# Patient Record
Sex: Female | Born: 1970 | Hispanic: Yes | Smoking: Former smoker
Health system: Southern US, Community
[De-identification: ages and names within clinical notes are randomized; demographics above are authoritative.]

## PROBLEM LIST (undated history)

## (undated) DIAGNOSIS — A150 Tuberculosis of lung: Secondary | ICD-10-CM

## (undated) DIAGNOSIS — K219 Gastro-esophageal reflux disease without esophagitis: Secondary | ICD-10-CM

## (undated) HISTORY — PX: CHOLECYSTECTOMY: SHX55

## (undated) HISTORY — PX: ABDOMINAL HYSTERECTOMY: SHX81

---

## 2004-04-20 ENCOUNTER — Ambulatory Visit: Payer: Self-pay | Admitting: Family Medicine

## 2010-04-16 DIAGNOSIS — L309 Dermatitis, unspecified: Secondary | ICD-10-CM | POA: Insufficient documentation

## 2010-05-18 ENCOUNTER — Emergency Department: Payer: Self-pay | Admitting: Emergency Medicine

## 2010-10-25 DIAGNOSIS — M130 Polyarthritis, unspecified: Secondary | ICD-10-CM | POA: Insufficient documentation

## 2011-01-08 DIAGNOSIS — M659 Synovitis and tenosynovitis, unspecified: Secondary | ICD-10-CM | POA: Insufficient documentation

## 2011-02-06 DIAGNOSIS — D1630 Benign neoplasm of short bones of unspecified lower limb: Secondary | ICD-10-CM | POA: Insufficient documentation

## 2012-08-08 ENCOUNTER — Emergency Department: Payer: Self-pay | Admitting: Internal Medicine

## 2012-12-02 ENCOUNTER — Emergency Department: Payer: Self-pay | Admitting: Emergency Medicine

## 2012-12-02 LAB — URINALYSIS, COMPLETE
Blood: NEGATIVE
Ketone: NEGATIVE
Leukocyte Esterase: NEGATIVE
Nitrite: NEGATIVE
Ph: 6 (ref 4.5–8.0)
RBC,UR: 2 /HPF (ref 0–5)
Specific Gravity: 1.017 (ref 1.003–1.030)
WBC UR: NONE SEEN /HPF (ref 0–5)

## 2013-02-19 DIAGNOSIS — G8929 Other chronic pain: Secondary | ICD-10-CM | POA: Insufficient documentation

## 2014-03-09 ENCOUNTER — Emergency Department: Payer: Self-pay | Admitting: Emergency Medicine

## 2015-09-22 ENCOUNTER — Encounter: Payer: Self-pay | Admitting: Sports Medicine

## 2015-09-22 ENCOUNTER — Ambulatory Visit (INDEPENDENT_AMBULATORY_CARE_PROVIDER_SITE_OTHER): Payer: Self-pay | Admitting: Sports Medicine

## 2015-09-22 DIAGNOSIS — B070 Plantar wart: Secondary | ICD-10-CM

## 2015-09-22 DIAGNOSIS — M79671 Pain in right foot: Secondary | ICD-10-CM

## 2015-09-22 DIAGNOSIS — M79672 Pain in left foot: Secondary | ICD-10-CM

## 2015-09-22 NOTE — Patient Instructions (Signed)
Plantar Warts Plantar warts are small growths on the bottom of the foot (sole). Warts are caused by a type of germ (virus). Most warts are not painful, and they usually do not cause problems. Sometimes, plantar warts can cause pain when you walk. Warts often go away on their own in time. Treatments may be done if needed. HOME CARE General Instructions  Apply creams or solutions only as told by your doctor. Follow these steps if your doctor tells you to do so:  Soak your foot in warm water.  Remove the top layer of softened skin before you apply the medicine. You can use a pumice stone to remove the tissue.  After you apply the medicine, put a bandage over the area of the wart.  Repeat the process every day or as told by your doctor.  Do not scratch or pick at a wart.  Wash your hands after you touch a wart.  If a wart is painful, try putting a bandage with a hole in the middle over the wart.  Keep all follow-up visits as told by your doctor. This is important. Prevention  Wear shoes and socks. Change socks every day.  Keep your feet clean and dry.  Check your feet often.  Avoid direct contact with warts on other people. GET HELP IF:  Your warts do not improve after treatment.  You have redness, swelling, or pain at the site of a wart.  You have bleeding from a wart, and the bleeding does not stop when you put light pressure on the wart.  You have diabetes and you get a wart.   This information is not intended to replace advice given to you by your health care provider. Make sure you discuss any questions you have with your health care provider.   Document Released: 04/27/2010 Document Revised: 12/14/2014 Document Reviewed: 06/20/2014 Elsevier Interactive Patient Education [employer].

## 2015-09-22 NOTE — Progress Notes (Signed)
Patient ID: Rhonda Olsen, female   DOB: [birth year], 45 y.o.   MRN: [id number] Subjective: Rhonda Olsen is a 45 y.o. female patient who presents to office for evaluation of Right> Left foot pain secondary to painful wart at the ball and 3rd toes. Patient has tried OTC freezing with no relief in symptoms. Reports feels best in tennis shoes. States that she has been dealing with this for several years. Patient denies any other pedal complaints.   Patient Active Problem List   Diagnosis Date Noted  . Chronic female pelvic pain 02/19/2013  . 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 Synovitis and tenosynovitis 01/08/2011  . Inflammation of multiple joints 10/25/2010  . Dermatitis, eczematoid 04/16/2010    No current outpatient prescriptions on file prior to visit.   No current facility-administered medications on file prior to visit.    No Known Allergies  Objective:  General: Alert and oriented x3 in no acute distress  Dermatology: Keratotic lesions present measuring <0.3cm at Sub met 2 R>L and 3rd toes bilateral with no skin lines transversing the lesion, pain is present with medial lateral pressure to the lesion, capillaries with pin point bleeding noted, no webspace macerations, no ecchymosis bilateral, all nails x 10 are well manicured.  Vascular: Dorsalis Pedis and Posterior Tibial pedal pulses 2/4, Capillary Fill Time 3 seconds, + pedal hair growth bilateral, no edema bilateral lower extremities, Temperature gradient within normal limits.  Neurology: Johney Maine sensation intact via light touch bilateral.  Musculoskeletal: Mild tenderness with palpation at the lesion sites on Right>Left, Muscular strength 5/5 in all groups without pain or limitation on range of motion. No lower extremity muscular or boney deformity noted.  Assessment and Plan: Problem List Items Addressed This Visit    None    Visit Diagnoses    Plantar wart of both feet     -  Primary    Foot pain, bilateral          -Complete examination performed -Discussed treatment options for wart -Parred keratoic warty lesions using a chisel blade; treated the areas with Catharidin covered with bandaid; Advised patient of blistering reaction that will occur from application of medication and once this happens replace bandaid with neosporin and tape/bandaid -Patient to return to office in 4 weeks or sooner if condition worsens.  Landis Martins, DPM

## 2015-10-13 ENCOUNTER — Ambulatory Visit (INDEPENDENT_AMBULATORY_CARE_PROVIDER_SITE_OTHER): Payer: Self-pay | Admitting: Sports Medicine

## 2015-10-13 ENCOUNTER — Encounter: Payer: Self-pay | Admitting: Sports Medicine

## 2015-10-13 DIAGNOSIS — B070 Plantar wart: Secondary | ICD-10-CM

## 2015-10-13 DIAGNOSIS — M79671 Pain in right foot: Secondary | ICD-10-CM

## 2015-10-13 DIAGNOSIS — M79672 Pain in left foot: Secondary | ICD-10-CM

## 2015-10-13 NOTE — Progress Notes (Signed)
Patient ID: Rhonda Olsen, female   DOB: 1970/06/15, 45 y.o.   MRN: [id number]  Subjective: Rhonda Olsen is a 45 y.o. female patient who returns to office for follow up evaluation of Right> Left foot pain secondary to painful wart at the ball and 3rd toes. Patient states that she got a small blister reaction and a little relief from the treatment however lesions are still there; patient is interested in surgical excision and laser. Patient denies any other pedal complaints.   Patient Active Problem List   Diagnosis Date Noted  . Chronic female pelvic pain 02/19/2013  . 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 Synovitis and tenosynovitis 01/08/2011  . Inflammation of multiple joints 10/25/2010  . Dermatitis, eczematoid 04/16/2010    Current Outpatient Prescriptions on File Prior to Visit  Medication Sig Dispense Refill  . sertraline (ZOLOFT) 50 MG tablet Take 50 mg by mouth.     No current facility-administered medications on file prior to visit.    No Known Allergies  Objective:  General: Alert and oriented x3 in no acute distress  Dermatology: Keratotic lesions present measuring <0.3cm at Sub met 2 R>L and 3rd toes bilateral with no skin lines transversing the lesion, pain is present with medial lateral pressure to the lesion, capillaries with pin point bleeding noted, there is also callus at medial hallux bilateral and sub met 5 left, no webspace macerations, no ecchymosis bilateral, all nails x 10 are well manicured.  Vascular: Dorsalis Pedis and Posterior Tibial pedal pulses 2/4, Capillary Fill Time 3 seconds, + pedal hair growth bilateral, no edema bilateral lower extremities, Temperature gradient within normal limits.  Neurology: Johney Maine sensation intact via light touch bilateral.  Musculoskeletal: Mild tenderness with palpation at the lesion sites on Right>Left, Muscular strength 5/5 in all groups without pain or limitation on range of  motion. No lower extremity muscular or boney deformity noted.  Assessment and Plan: Problem List Items Addressed This Visit    None    Visit Diagnoses    Plantar wart of both feet    -  Primary    Foot pain, bilateral          -Complete examination performed -Discussed treatment options for wart and answered all questions -Patient reports that she's self pay and can not at this time afford excision and laser -Parred keratoic warty lesions using a chisel blade; treated the areas with Catharidin covered with bandaid; Advised patient of blistering reaction that will occur from application of medication and once this happens replace bandaid with neosporin and tape/bandaid -Recommended topical wart treatment creams; patient declined due to financial restraints at this time  -Patient to return to office in 4 weeks or sooner if condition worsens.  Landis Martins, DPM

## 2015-11-17 ENCOUNTER — Ambulatory Visit (INDEPENDENT_AMBULATORY_CARE_PROVIDER_SITE_OTHER): Payer: Self-pay | Admitting: Sports Medicine

## 2015-11-17 ENCOUNTER — Encounter: Payer: Self-pay | Admitting: Sports Medicine

## 2015-11-17 DIAGNOSIS — B070 Plantar wart: Secondary | ICD-10-CM

## 2015-11-17 DIAGNOSIS — M79672 Pain in left foot: Secondary | ICD-10-CM

## 2015-11-17 DIAGNOSIS — M79671 Pain in right foot: Secondary | ICD-10-CM

## 2015-11-17 NOTE — Progress Notes (Signed)
Patient ID: Rhonda Olsen, female   DOB: 06-10-1970, 45 y.o.   MRN: [id number]  Subjective: Rhonda Olsen is a 45 y.o. female patient who returns to office for follow up evaluation of Right> Left foot pain secondary to painful warts at the ball and 3rd toes. Patient states she thinks that the 2nd treatment worked and that she got a blister with the lesions now gone. Patient denies any other pedal complaints.   Patient Active Problem List   Diagnosis Date Noted  . Chronic female pelvic pain 02/19/2013  . 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 Synovitis and tenosynovitis 01/08/2011  . Inflammation of multiple joints 10/25/2010  . Dermatitis, eczematoid 04/16/2010    Current Outpatient Prescriptions on File Prior to Visit  Medication Sig Dispense Refill  . sertraline (ZOLOFT) 50 MG tablet Take 50 mg by mouth.     No current facility-administered medications on file prior to visit.     No Known Allergies  Objective:  General: Alert and oriented x3 in no acute distress  Dermatology: Keratotic warty lesions resolved, no webspace macerations, no ecchymosis bilateral, all nails x 10 are well manicured.  Vascular: Dorsalis Pedis and Posterior Tibial pedal pulses 2/4, Capillary Fill Time 3 seconds, + pedal hair growth bilateral, no edema bilateral lower extremities, Temperature gradient within normal limits.  Neurology: Gross sensation intact via light touch bilateral.  Musculoskeletal: No tenderness to palpation bilateral, Muscular strength 5/5 in all groups without pain or limitation on range of motion. No lower extremity muscular or boney deformity noted.  Assessment and Plan: Problem List Items Addressed This Visit    None    Visit Diagnoses    Plantar wart of both feet    -  Primary   Foot pain, bilateral         -Complete examination performed -Warts are resolved at this time -Advised good hygiene habits to prevent recurrence   -Patient to return to office as needed or sooner if condition worsens.  Landis Martins, DPM

## 2016-04-29 ENCOUNTER — Emergency Department: Payer: Worker's Compensation

## 2016-04-29 ENCOUNTER — Encounter: Payer: Self-pay | Admitting: Emergency Medicine

## 2016-04-29 ENCOUNTER — Emergency Department
Admission: EM | Admit: 2016-04-29 | Discharge: 2016-04-29 | Disposition: A | Discharge status: Home / Self Care | Payer: Worker's Compensation | Attending: Emergency Medicine | Admitting: Emergency Medicine

## 2016-04-29 DIAGNOSIS — S199XXA Unspecified injury of neck, initial encounter: Secondary | ICD-10-CM | POA: Diagnosis present

## 2016-04-29 DIAGNOSIS — Y929 Unspecified place or not applicable: Secondary | ICD-10-CM | POA: Diagnosis not present

## 2016-04-29 DIAGNOSIS — Y939 Activity, unspecified: Secondary | ICD-10-CM | POA: Insufficient documentation

## 2016-04-29 DIAGNOSIS — M25511 Pain in right shoulder: Secondary | ICD-10-CM | POA: Insufficient documentation

## 2016-04-29 DIAGNOSIS — M542 Cervicalgia: Secondary | ICD-10-CM | POA: Insufficient documentation

## 2016-04-29 DIAGNOSIS — M545 Low back pain: Secondary | ICD-10-CM | POA: Diagnosis not present

## 2016-04-29 DIAGNOSIS — Y990 Civilian activity done for income or pay: Secondary | ICD-10-CM | POA: Diagnosis not present

## 2016-04-29 DIAGNOSIS — W19XXXA Unspecified fall, initial encounter: Secondary | ICD-10-CM | POA: Insufficient documentation

## 2016-04-29 MED ORDER — KETOROLAC TROMETHAMINE 30 MG/ML IJ SOLN
30.0000 mg | Freq: Once | INTRAMUSCULAR | Status: AC
  Administered 2016-04-29: 30 mg via INTRAMUSCULAR
  Filled 2016-04-29: qty 1

## 2016-04-29 MED ORDER — MELOXICAM 7.5 MG PO TABS: 7.5000 mg | ORAL_TABLET | Freq: Every day | ORAL | 1 refills | Status: AC

## 2016-04-29 NOTE — ED Triage Notes (Signed)
Pt from home with back pain as well as neck and shoulder pain after fall at work. Pt went to urgent care and was told to come here. Pt speaks spanish. She works at [employer] in [employer].

## 2016-04-29 NOTE — ED Notes (Signed)
The workers compensation was completed and walked to the lab.

## 2016-04-29 NOTE — ED Provider Notes (Signed)
[facility] Emergency Department Provider Note  ____________________________________________  Time seen: Approximately 4:35 PM  I have reviewed the triage vital signs and the nursing notes.   HISTORY  Chief Complaint Fall    HPI Rhonda Olsen is a 46 y.o. female presenting to the emergency department after a fall at her place of work, [employer]. Patient denies hitting her head or loss of consciousness. She reports neck pain worsened with movement, right shoulder pain and low back pain. She currently rates her back pain at 6 out of 10 in intensity and describes it as sore. Patient states that she resumed working today but aforementioned pain prompted her to seek care at the emergency department. She denies chest pain, shortness of breath, blurry vision, nausea, vomiting and abdominal pain. She has not attempted alleviating measures. Patient is right handed.    History reviewed. No pertinent past medical history.  Patient Active Problem List   Diagnosis Date Noted  . Chronic female pelvic pain 02/19/2013  . 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 Synovitis and tenosynovitis 01/08/2011  . Inflammation of multiple joints 10/25/2010  . Dermatitis, eczematoid 04/16/2010    Past Surgical History:  Procedure Laterality Date  . CHOLECYSTECTOMY      Prior to Admission medications   Medication Sig Start Date End Date Taking? Authorizing Provider  meloxicam (MOBIC) 7.5 MG tablet Take 1 tablet (7.5 mg total) by mouth daily. 04/29/16 05/06/16  Lannie Fields, PA-C  sertraline (ZOLOFT) 50 MG tablet Take 50 mg by mouth.    Historical Provider, MD    Allergies Patient has no known allergies.  History reviewed. No pertinent family history.  Social History Social History  Substance Use Topics  . Smoking status: Never Smoker  . Smokeless tobacco: Never Used  . Alcohol use No     Review of Systems  Eyes: No visual changes.  No discharge ENT: No upper respiratory complaints. Cardiovascular: no chest pain. Respiratory: no cough. No SOB. Gastrointestinal: No abdominal pain.  No nausea, no vomiting.  No diarrhea.  No constipation. Musculoskeletal: Patient has right shoulder pain, neck pain and low back pain.  Skin: Negative for rash, abrasions, lacerations, ecchymosis. Neurological: Negative for headaches, focal weakness or numbness. ____________________________________________   PHYSICAL EXAM:  VITAL SIGNS: ED Triage Vitals  Enc Vitals Group     BP 04/29/16 1556 (!) 106/46     Pulse Rate 04/29/16 1556 68     Resp --      Temp 04/29/16 1556 98.8 F (37.1 C)     Temp Source 04/29/16 1556 Oral     SpO2 04/29/16 1556 100 %     Weight 04/29/16 1557 130 lb (59 kg)     Height 04/29/16 1557 5\' 2"  (1.575 m)     Head Circumference --      Peak Flow --      Pain Score 04/29/16 1558 5     Pain Loc --      Pain Edu? --      Excl. in [place]? --      Constitutional: Alert and oriented. Well appearing and in no acute distress. Eyes: Conjunctivae are normal. PERRL. EOMI. Head: Atraumatic. Neck: No stridor. Patient has pain with range of motion testing. No pain with palpation along the cervical spine. Cardiovascular: Normal rate, regular rhythm. Normal S1 and S2.  Good peripheral circulation. Respiratory: Normal respiratory effort without tachypnea or retractions. Lungs CTAB. Good air entry to the bases  with no decreased or absent breath sounds. Musculoskeletal: Patient has 5/5 strength in the upper and lower extremities bilaterally. Full range of motion at the shoulder, elbow and wrist bilaterally. Full range of motion at the hip, knee and ankle bilaterally. No changes in gait. Patient demonstrates no weakness or pain with rotator cuff testing, right. Patient has no tenderness elicited with palpation along the [facility] joint. Patient's low back pain is worsened with flexion at the spine. No pain with palpation along the  lumbar and thoracic regions of the spine. Palpable radial, ulnar and dorsalis pedis pulses bilaterally and symmetrically. Neurologic:  Normal speech and language. No gross focal neurologic deficits are appreciated. Cranial nerves: 2-10 normal as tested. Cerebellar: Finger-nose-finger WNL, heel to shin WNL.  Skin:  Skin is warm, dry and intact. No rash noted. Psychiatric: Mood and affect are normal. Speech and behavior are normal. Patient exhibits appropriate insight and judgement. ____________________________________________   LABS (all labs ordered are listed, but only abnormal results are displayed)  Labs Reviewed - No data to display ____________________________________________  EKG   ____________________________________________  RADIOLOGY Unk Pinto, personally viewed and evaluated these images (plain radiographs) as part of my medical decision making, as well as reviewing the written report by the radiologist.  Dg Cervical Spine Complete  Result Date: 04/29/2016 CLINICAL DATA:  Fall. EXAM: CERVICAL SPINE - COMPLETE 4+ VIEW COMPARISON:  12/02/2012 FINDINGS: There is no evidence of cervical spine fracture or prevertebral soft tissue swelling. Alignment is normal. No other significant bone abnormalities are identified. IMPRESSION: Negative cervical spine radiographs. Electronically Signed   By: Kerby Moors M.D.   On: 04/29/2016 17:30   Dg Lumbar Spine Complete  Result Date: 04/29/2016 CLINICAL DATA:  [place] at work today EXAM: [place] 4+ VIEW COMPARISON:  12/02/2012 FINDINGS: 5 non-rib-bearing lumbar vertebra. Disc space narrowing at L5-S1 with small endplate spurs. Vertebral body and disc space heights otherwise maintained. Minimal retrolisthesis at L5-S1. No acute fracture, additional subluxation or bone destruction. No spondylolysis. SI joints preserved. IMPRESSION: Degenerative disc disease changes L5-S1. No acute osseous abnormalities. Electronically Signed    By: Lavonia Dana M.D.   On: 04/29/2016 17:27    ____________________________________________    PROCEDURES  Procedure(s) performed:    Procedures    Medications  ketorolac (TORADOL) 30 MG/ML injection 30 mg (30 mg Intramuscular Given 04/29/16 1700)     ____________________________________________   INITIAL IMPRESSION / ASSESSMENT AND PLAN / ED COURSE  Pertinent labs & imaging results that were available during my care of the patient were reviewed by me and considered in my medical decision making (see chart for details).  Review of the [place] CSRS was performed in accordance of the [place] prior to dispensing any controlled drugs.   Assessment and Plan:  Fall: Patient presents with right shoulder pain, neck pain and low back pain after a fall today at her work. DG lumbar spine and DG cervical spine did not reveal acute bony abnormalities. On physical exam, patient demonstrated full range of motion at the right shoulder with no rotator cuff weakness. No pain was elicited with shoulder range of motion, right. X-ray examination of the right shoulder is not warranted at this time. Patient was prescribed Mobic at discharge. A referral was made to orthopedics, Dr. Roland Rack. Patient was advised to make an appointment in one week if low back pain, right shoulder pain and neck pain persists. All patient questions were answered. _________________________________________  FINAL CLINICAL IMPRESSION(S) / ED DIAGNOSES  Final diagnoses:  Fall, initial encounter      NEW MEDICATIONS STARTED DURING THIS VISIT:  Discharge Medication List as of 04/29/2016  5:45 PM    START taking these medications   Details  meloxicam (MOBIC) 7.5 MG tablet Take 1 tablet (7.5 mg total) by mouth daily., Starting Mon 04/29/2016, Until Mon 05/06/2016, Print            This chart was dictated using voice recognition software/Dragon. Despite best efforts to proofread, errors can occur which can change the meaning.  Any change was purely unintentional.    Lannie Fields, PA-C 04/29/16 1902    Harvest Dark, MD 04/29/16 [phone]

## 2016-09-10 ENCOUNTER — Emergency Department
Admission: EM | Admit: 2016-09-10 | Discharge: 2016-09-10 | Disposition: A | Discharge status: Home / Self Care | Payer: Self-pay | Attending: Emergency Medicine | Admitting: Emergency Medicine

## 2016-09-10 DIAGNOSIS — R519 Headache, unspecified: Secondary | ICD-10-CM

## 2016-09-10 DIAGNOSIS — R110 Nausea: Secondary | ICD-10-CM | POA: Insufficient documentation

## 2016-09-10 DIAGNOSIS — J029 Acute pharyngitis, unspecified: Secondary | ICD-10-CM | POA: Insufficient documentation

## 2016-09-10 DIAGNOSIS — Z79899 Other long term (current) drug therapy: Secondary | ICD-10-CM | POA: Insufficient documentation

## 2016-09-10 DIAGNOSIS — R51 Headache: Secondary | ICD-10-CM

## 2016-09-10 LAB — URINALYSIS, COMPLETE (UACMP) WITH MICROSCOPIC
BACTERIA UA: NONE SEEN
BILIRUBIN URINE: NEGATIVE
Glucose, UA: NEGATIVE mg/dL
Ketones, ur: NEGATIVE mg/dL
Leukocytes, UA: NEGATIVE
NITRITE: NEGATIVE
PROTEIN: NEGATIVE mg/dL
Specific Gravity, Urine: 1.003 — ABNORMAL LOW (ref 1.005–1.030)
WBC UA: NONE SEEN WBC/hpf (ref 0–5)
pH: 6 (ref 5.0–8.0)

## 2016-09-10 LAB — COMPREHENSIVE METABOLIC PANEL
ALBUMIN: 4.5 g/dL (ref 3.5–5.0)
ALT: 19 U/L (ref 14–54)
ANION GAP: 7 (ref 5–15)
AST: 24 U/L (ref 15–41)
Alkaline Phosphatase: 87 U/L (ref 38–126)
BILIRUBIN TOTAL: 0.6 mg/dL (ref 0.3–1.2)
BUN: 13 mg/dL (ref 6–20)
CO2: 29 mmol/L (ref 22–32)
Calcium: 9.3 mg/dL (ref 8.9–10.3)
Chloride: 102 mmol/L (ref 101–111)
Creatinine, Ser: 0.4 mg/dL — ABNORMAL LOW (ref 0.44–1.00)
GFR calc non Af Amer: >60 mL/min (ref >60)
GLUCOSE: 92 mg/dL (ref 65–99)
POTASSIUM: 3.5 mmol/L (ref 3.5–5.1)
Sodium: 138 mmol/L (ref 135–145)
TOTAL PROTEIN: 8.3 g/dL — AB (ref 6.5–8.1)

## 2016-09-10 LAB — CBC
HEMATOCRIT: 37.8 % (ref 35.0–47.0)
HEMOGLOBIN: 13 g/dL (ref 12.0–16.0)
MCH: 29.6 pg (ref 26.0–34.0)
MCHC: 34.2 g/dL (ref 32.0–36.0)
MCV: 86.3 fL (ref 80.0–100.0)
Platelets: 294 10*3/uL (ref 150–440)
RBC: 4.38 MIL/uL (ref 3.80–5.20)
RDW: 12.7 % (ref 11.5–14.5)
WBC: 5.1 10*3/uL (ref 3.6–11.0)

## 2016-09-10 LAB — LIPASE, BLOOD: Lipase: 27 U/L (ref 11–51)

## 2016-09-10 MED ORDER — ONDANSETRON 4 MG PO TBDP: 4.0000 mg | ORAL_TABLET | Freq: Three times a day (TID) | ORAL | 0 refills | Status: DC | PRN

## 2016-09-10 MED ORDER — ONDANSETRON 4 MG PO TBDP
4.0000 mg | ORAL_TABLET | Freq: Once | ORAL | Status: AC | PRN
  Administered 2016-09-10: 4 mg via ORAL
  Filled 2016-09-10: qty 1

## 2016-09-10 MED ORDER — SODIUM CHLORIDE 0.9 % IV BOLUS (SEPSIS)
1000.0000 mL | Freq: Once | INTRAVENOUS | Status: AC
  Administered 2016-09-10: 1000 mL via INTRAVENOUS

## 2016-09-10 MED ORDER — DIPHENHYDRAMINE HCL 50 MG/ML IJ SOLN
25.0000 mg | Freq: Once | INTRAMUSCULAR | Status: AC
  Administered 2016-09-10: 25 mg via INTRAVENOUS
  Filled 2016-09-10: qty 1

## 2016-09-10 MED ORDER — KETOROLAC TROMETHAMINE 30 MG/ML IJ SOLN
30.0000 mg | Freq: Once | INTRAMUSCULAR | Status: AC
  Administered 2016-09-10: 30 mg via INTRAVENOUS
  Filled 2016-09-10: qty 1

## 2016-09-10 MED ORDER — DEXAMETHASONE SODIUM PHOSPHATE 10 MG/ML IJ SOLN
10.0000 mg | Freq: Once | INTRAMUSCULAR | Status: AC
  Administered 2016-09-10: 10 mg via INTRAVENOUS
  Filled 2016-09-10 (×2): qty 1

## 2016-09-10 MED ORDER — METOCLOPRAMIDE HCL 5 MG/ML IJ SOLN
10.0000 mg | Freq: Once | INTRAMUSCULAR | Status: AC
  Administered 2016-09-10: 10 mg via INTRAVENOUS
  Filled 2016-09-10: qty 2

## 2016-09-10 NOTE — ED Triage Notes (Signed)
Pt arrives to ER via POV c/o headache, sore throat, nausea and vomiting greater than 1 week. Pt reports her lymph nodes are swollen in her neck. Pt tearful Pt alert and oriented X4, active, cooperative, pt in NAD. RR even and unlabored, color WNL.   Interpreter used for discharge.

## 2016-09-10 NOTE — ED Provider Notes (Signed)
[facility] Emergency Department Provider Note  Time seen: 3:47 PM  I have reviewed the triage vital signs and the nursing notes.   HISTORY  Chief Complaint Headache; Sore Throat; and Nausea Hospital interpreter used for this evaluation.   HPI Rhonda Olsen is a 46 y.o. female with a past medical history of chronic pelvic pain, presents to the emergency department with vague complaints.Patient states for the past several months she has been experiencing intermittent pain and pressure sensation in her neck. Denies any pain with swallowing or any difficulty breathing. It feels like a sore throat. She also states intermittent headache, worse over the past 2 days. Denies any weakness or numbness. Patient also states a generalized fatigue sensation. Denies any fever, cough or congestion. Patient does take months to years of vague abdominal discomfort which she describes as bloating, stating intermittent nausea and vomiting including one episode of vomiting this morning. Denies any dysuria or hematuria. Denies vaginal bleeding or discharge. Patient's main complaint today appears to be headache with nausea.  History reviewed. No pertinent past medical history.  Patient Active Problem List   Diagnosis Date Noted  . Chronic female pelvic pain 02/19/2013  . 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 Synovitis and tenosynovitis 01/08/2011  . Inflammation of multiple joints 10/25/2010  . Dermatitis, eczematoid 04/16/2010    Past Surgical History:  Procedure Laterality Date  . CHOLECYSTECTOMY      Prior to Admission medications   Medication Sig Start Date End Date Taking? Authorizing Provider  sertraline (ZOLOFT) 50 MG tablet Take 50 mg by mouth.    [provider]    No Known Allergies  No family history on file.  Social History Social History  Substance Use Topics  . Smoking status: Never Smoker  . Smokeless tobacco:  Never Used  . Alcohol use No    Review of Systems Constitutional: Negative for fever. Eyes: Negative for visual changes. ENT: Some congestion. Cardiovascular: Negative for chest pain. Respiratory: Negative for shortness of breath. Gastrointestinal: Intermittent abdominal bloating. Positive for nausea or vomiting. Negative for diarrhea. Genitourinary: Negative for dysuria. Negative for vaginal bleeding or discharge. Musculoskeletal: Negative for back pain. Skin: Negative for rash. Neurological: Moderate headache. All other ROS negative  ____________________________________________   PHYSICAL EXAM:  VITAL SIGNS: ED Triage Vitals [09/10/16 1147]  Enc Vitals Group     BP 113/65     Pulse Rate 76     Resp 18     Temp 97.7 F (36.5 C)     Temp Source Oral     SpO2 100 %     Weight 138 lb (62.6 kg)     Height 5\' 4"  (1.626 m)     Head Circumference      Peak Flow      Pain Score 8     Pain Loc      Pain Edu?      Excl. in [place]?     Constitutional: Alert. Well appearing and in no distress. Eyes: Normal exam ENT   Head: Normocephalic and atraumatic.   Mouth/Throat: Mucous membranes are moist. No pharyngeal erythema or exudate. Normal oral pharynx. Cardiovascular: Normal rate, regular rhythm. No murmur Respiratory: Normal respiratory effort without tachypnea nor retractions. Breath sounds are clear Gastrointestinal: Soft and nontender. No distention.   Musculoskeletal: Nontender with normal range of motion in all extremities.  Neurologic:  Normal speech and language. No gross focal neurologic deficits Skin:  Skin is warm,  dry and intact.  Psychiatric: Mood and affect are normal.   ____________________________________________    INITIAL IMPRESSION / ASSESSMENT AND PLAN / ED COURSE  Pertinent labs & imaging results that were available during my care of the patient were reviewed by me and considered in my medical decision making (see chart for details).  The  patient presents the emergency department with multiple complaints including headache intermittent times weeks but worse over the past 2 days along with nausea and vomiting. Abdominal bloating times years. Sore throat sensation times months. On exam overall the patient appears well she does appear to have a small left tympanic membrane rupture, patient denies any pain in the left ear but states a history of ear infections especially when she was younger (likely chronic). Patient's throat appears normal with no erythema or exudate. No increased cervical lymphadenopathy noted. Patient states intermittent abdominal pain times years but nontender exam today. Patient's labs overall are normal. Given the patient's headache and nausea we will treat with Toradol Reglan and Benadryl. We will also dose IV Decadron. Overall the patient appears very well, no distress. Spanish interpreter used throughout the evaluation and exam.  Patient states headache is much improved currently a 1 or 2/10. Patient will be discharged home with PCP follow-up.  ____________________________________________   FINAL CLINICAL IMPRESSION(S) / ED DIAGNOSES  Headache Nausea    Harvest Dark, MD 09/10/16 1655

## 2016-09-10 NOTE — ED Notes (Addendum)
Interpreter at bedside with MD. Pt c/o sore neck xfew months but worse today. Pt describes pain as "chocking sensation". Pt denies SOB or fever. Pt A&OX4, appears fatigued

## 2016-09-10 NOTE — ED Notes (Signed)
Discharge done with interpreter at bedside

## 2018-09-18 ENCOUNTER — Emergency Department: Payer: HRSA Program

## 2018-09-18 ENCOUNTER — Emergency Department
Admission: EM | Admit: 2018-09-18 | Discharge: 2018-09-18 | Disposition: A | Discharge status: Home / Self Care | Payer: HRSA Program | Attending: Emergency Medicine | Admitting: Emergency Medicine

## 2018-09-18 ENCOUNTER — Encounter: Payer: Self-pay | Admitting: Emergency Medicine

## 2018-09-18 ENCOUNTER — Other Ambulatory Visit: Payer: Self-pay

## 2018-09-18 DIAGNOSIS — Z7189 Other specified counseling: Secondary | ICD-10-CM

## 2018-09-18 DIAGNOSIS — R0602 Shortness of breath: Secondary | ICD-10-CM | POA: Diagnosis not present

## 2018-09-18 DIAGNOSIS — Z20828 Contact with and (suspected) exposure to other viral communicable diseases: Secondary | ICD-10-CM | POA: Diagnosis not present

## 2018-09-18 DIAGNOSIS — R5383 Other fatigue: Secondary | ICD-10-CM | POA: Insufficient documentation

## 2018-09-18 DIAGNOSIS — R51 Headache: Secondary | ICD-10-CM | POA: Diagnosis present

## 2018-09-18 HISTORY — DX: Tuberculosis of lung: A15.0

## 2018-09-18 LAB — COMPREHENSIVE METABOLIC PANEL
ALT: 24 U/L (ref 0–44)
AST: 28 U/L (ref 15–41)
Albumin: 4.3 g/dL (ref 3.5–5.0)
Alkaline Phosphatase: 93 U/L (ref 38–126)
Anion gap: 9 (ref 5–15)
BUN: 12 mg/dL (ref 6–20)
CO2: 26 mmol/L (ref 22–32)
Calcium: 8.7 mg/dL — ABNORMAL LOW (ref 8.9–10.3)
Chloride: 104 mmol/L (ref 98–111)
Creatinine, Ser: 0.45 mg/dL (ref 0.44–1.00)
GFR calc Af Amer: >60 mL/min (ref >60)
GFR calc non Af Amer: >60 mL/min (ref >60)
Glucose, Bld: 97 mg/dL (ref 70–99)
Potassium: 3.7 mmol/L (ref 3.5–5.1)
Sodium: 139 mmol/L (ref 135–145)
Total Bilirubin: 0.8 mg/dL (ref 0.3–1.2)
Total Protein: 7.9 g/dL (ref 6.5–8.1)

## 2018-09-18 LAB — CBC WITH DIFFERENTIAL/PLATELET
Abs Immature Granulocytes: 0 10*3/uL (ref 0.00–0.07)
Basophils Absolute: 0 10*3/uL (ref 0.0–0.1)
Basophils Relative: 1 %
Eosinophils Absolute: 0.1 10*3/uL (ref 0.0–0.5)
Eosinophils Relative: 3 %
HCT: 37 % (ref 36.0–46.0)
Hemoglobin: 12.2 g/dL (ref 12.0–15.0)
Immature Granulocytes: 0 %
Lymphocytes Relative: 42 %
Lymphs Abs: 2 10*3/uL (ref 0.7–4.0)
MCH: 28.8 pg (ref 26.0–34.0)
MCHC: 33 g/dL (ref 30.0–36.0)
MCV: 87.3 fL (ref 80.0–100.0)
Monocytes Absolute: 0.3 10*3/uL (ref 0.1–1.0)
Monocytes Relative: 7 %
Neutro Abs: 2.2 10*3/uL (ref 1.7–7.7)
Neutrophils Relative %: 47 %
Platelets: 260 10*3/uL (ref 150–400)
RBC: 4.24 MIL/uL (ref 3.87–5.11)
RDW: 12.3 % (ref 11.5–15.5)
WBC: 4.6 10*3/uL (ref 4.0–10.5)
nRBC: 0 % (ref 0.0–0.2)

## 2018-09-18 LAB — URINALYSIS, COMPLETE (UACMP) WITH MICROSCOPIC
Bacteria, UA: NONE SEEN
Bilirubin Urine: NEGATIVE
Glucose, UA: NEGATIVE mg/dL
Hgb urine dipstick: NEGATIVE
Ketones, ur: NEGATIVE mg/dL
Leukocytes,Ua: NEGATIVE
Nitrite: NEGATIVE
Protein, ur: NEGATIVE mg/dL
Specific Gravity, Urine: 1.005 (ref 1.005–1.030)
pH: 6 (ref 5.0–8.0)

## 2018-09-18 LAB — POCT PREGNANCY, URINE: Preg Test, Ur: NEGATIVE

## 2018-09-18 LAB — SARS CORONAVIRUS 2 BY RT PCR (HOSPITAL ORDER, PERFORMED IN ~~LOC~~ HOSPITAL LAB): SARS Coronavirus 2: NEGATIVE

## 2018-09-18 LAB — TROPONIN I: Troponin I: <0.03 ng/mL (ref <0.03)

## 2018-09-18 LAB — FIBRIN DERIVATIVES D-DIMER (ARMC ONLY): Fibrin derivatives D-dimer (ARMC): 379.21 ng/mL (FEU) (ref 0.00–499.00)

## 2018-09-18 MED ORDER — ONDANSETRON 4 MG PO TBDP: 4.0000 mg | ORAL_TABLET | Freq: Three times a day (TID) | ORAL | 0 refills | Status: AC | PRN

## 2018-09-18 NOTE — Discharge Instructions (Addendum)
Your covid 19 test is negative. Take zofran odt for nausea as needed

## 2018-09-18 NOTE — ED Provider Notes (Addendum)
[facility] Emergency Department Provider Note  ____________________________________________   None    (approximate)  I have reviewed the triage vital signs and the nursing notes.   HISTORY  Chief Complaint Headache and Shortness of Breath    HPI Rhonda Olsen is a 48 y.o. female presents emergency department complaint of headache and shortness of breath for 2 days.  2 coworkers have tested positive for [place].  She is concerned.  She has some nausea.  No vomiting or diarrhea.  History of TB in which she took the medication.  She is concerned now about her lungs.  She denies fever or chills.  She denies chest pain.  She denies any urinary symptoms.    Past Medical History:  Diagnosis Date  . TB (pulmonary tuberculosis)     Patient Active Problem List   Diagnosis Date Noted  . Chronic female pelvic pain 02/19/2013  . 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 Synovitis and tenosynovitis 01/08/2011  . Inflammation of multiple joints 10/25/2010  . Dermatitis, eczematoid 04/16/2010    Past Surgical History:  Procedure Laterality Date  . CHOLECYSTECTOMY      Prior to Admission medications   Medication Sig Start Date End Date Taking? Authorizing Provider  ondansetron (ZOFRAN-ODT) 4 MG disintegrating tablet Take 1 tablet (4 mg total) by mouth every 8 (eight) hours as needed. 09/18/18   Markes Shatswell, Linden Dolin, PA-C  sertraline (ZOLOFT) 50 MG tablet Take 50 mg by mouth.    [provider]    Allergies Patient has no known allergies.  History reviewed. No pertinent family history.  Social History Social History   Tobacco Use  . Smoking status: Never Smoker  . Smokeless tobacco: Never Used  Substance Use Topics  . Alcohol use: No    Alcohol/week: 0.0 standard drinks  . Drug use: No    Review of Systems  Constitutional: No fever/chills, complaints of headache Eyes: No visual changes. ENT: No sore throat.  Respiratory: Denies cough complains of shortness of breath Genitourinary: Negative for dysuria. Musculoskeletal: Negative for back pain. Skin: Negative for rash.    ____________________________________________   PHYSICAL EXAM:  VITAL SIGNS: ED Triage Vitals  Enc Vitals Group     BP 09/18/18 1309 106/74     Pulse Rate 09/18/18 1309 74     Resp 09/18/18 1309 18     Temp 09/18/18 1309 98.6 F (37 C)     Temp Source 09/18/18 1309 Oral     SpO2 09/18/18 1309 100 %     Weight 09/18/18 1310 145 lb (65.8 kg)     Height 09/18/18 1310 5\' 4"  (1.626 m)     Head Circumference --      Peak Flow --      Pain Score 09/18/18 1309 8     Pain Loc --      Pain Edu? --      Excl. in [place]? --     Constitutional: Alert and oriented. Well appearing and in no acute distress. Eyes: Conjunctivae are normal.  Head: Atraumatic. Nose: No congestion/rhinnorhea. Mouth/Throat: Mucous membranes are moist.   Neck:  supple no lymphadenopathy noted Cardiovascular: Normal rate, regular rhythm. Heart sounds are normal Respiratory: Normal respiratory effort.  No retractions, lungs c t a  Abd: soft nontender bs normal all 4 quad GU: deferred Musculoskeletal: FROM all extremities, warm and well perfused Neurologic:  Normal speech and language.  Skin:  Skin is warm, dry and intact.  No rash noted. Psychiatric: Mood and affect are normal. Speech and behavior are normal.  ____________________________________________   LABS (all labs ordered are listed, but only abnormal results are displayed)  Labs Reviewed  COMPREHENSIVE METABOLIC PANEL - Abnormal; Notable for the following components:      Result Value   Calcium 8.7 (*)    All other components within normal limits  URINALYSIS, COMPLETE (UACMP) WITH MICROSCOPIC - Abnormal; Notable for the following components:   Color, Urine STRAW (*)    APPearance CLEAR (*)    All other components within normal limits  SARS CORONAVIRUS 2 (HOSPITAL ORDER, [place] [facility])  TROPONIN I  CBC WITH DIFFERENTIAL/PLATELET  FIBRIN DERIVATIVES D-DIMER ([facility] ONLY)  POC URINE PREG, ED  POCT PREGNANCY, URINE   ____________________________________________   ____________________________________________  RADIOLOGY  Chest x-ray is negative  ____________________________________________   PROCEDURES  Procedure(s) performed: EKG   Procedures    ____________________________________________   INITIAL IMPRESSION / ASSESSMENT AND PLAN / ED COURSE  Pertinent labs & imaging results that were available during my care of the patient were reviewed by me and considered in my medical decision making (see chart for details).   Patient is a 49 year old female presents emergency department complaining of headache and shortness of breath for 2 days.  Patient has 2 coworkers that are positive for [place].  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  Physical exam patient appears well.  She is wearing a and 95 mask.  Lungs clear to all station.  CBC, comprehensive metabolic panel, UA, PSA Prag, d-dimer, troponin,  COVID test   ddx: covid 19, pneumonia, bronchitis  POC pregnancy is negative, troponin is normal, CBC is normal, urinalysis is normal, comprehensive metabolic panel is normal and d-dimer is normal.  COVID-19 test is negative.  To the patient.  She is to take the Zofran as needed for nausea.  Drink plenty fluids.  Return emergency department for worsening.  Rhonda Olsen was evaluated in Emergency Department on 09/18/2018 for the symptoms described in the history of present illness. She was evaluated in the context of the global COVID-19 pandemic, which necessitated consideration that the patient might be at risk for infection with the SARS-CoV-2 virus that causes COVID-19. Institutional protocols and algorithms that pertain to the evaluation of patients at risk for COVID-19 are in a state of rapid change based on information released by  regulatory bodies including the CDC and federal and state organizations. These policies and algorithms were followed during the patient's care in the ED.  As part of my medical decision making, I reviewed the following data within the [facility] notes reviewed and incorporated, Interpreter needed, Labs reviewed see above, EKG interpreted NSR, Old chart reviewed, Radiograph reviewed chest x-ray normal, Notes from prior ED visits and [place] Controlled Substance Database  ____________________________________________   FINAL CLINICAL IMPRESSION(S) / ED DIAGNOSES  Final diagnoses:  Fatigue, unspecified type  Educated About Covid-19 Virus Infection      NEW MEDICATIONS STARTED DURING THIS VISIT:  New Prescriptions   ONDANSETRON (ZOFRAN-ODT) 4 MG DISINTEGRATING TABLET    Take 1 tablet (4 mg total) by mouth every 8 (eight) hours as needed.     Note:  This document was prepared using Dragon voice recognition software and may include unintentional dictation errors.    Versie Starks, PA-C 09/18/18 1540    Caryn Section Linden Dolin, PA-C 09/18/18 1716    Earleen Newport, MD 09/27/18 1500

## 2018-09-18 NOTE — ED Triage Notes (Signed)
Pt to ER from home with c/o headache and SHOB for last two days.  Pt states 2 co-workers have tested pos for [place].

## 2018-11-09 ENCOUNTER — Other Ambulatory Visit: Payer: Self-pay

## 2018-11-09 DIAGNOSIS — Z20822 Contact with and (suspected) exposure to covid-19: Secondary | ICD-10-CM

## 2018-11-10 LAB — NOVEL CORONAVIRUS, NAA: SARS-CoV-2, NAA: DETECTED — AB

## 2018-11-10 IMAGING — CR DG CERVICAL SPINE COMPLETE 4+V
1 series · 6 of 6 positions shown · non-contrast
Comparison: 12/02/2012

CLINICAL DATA: Fall.

EXAM:
CERVICAL SPINE - COMPLETE 4+ VIEW

[Series 1: w cervical spine lat · 0.14mm/px · 6 of 6 slices shown]
[im 1/6]
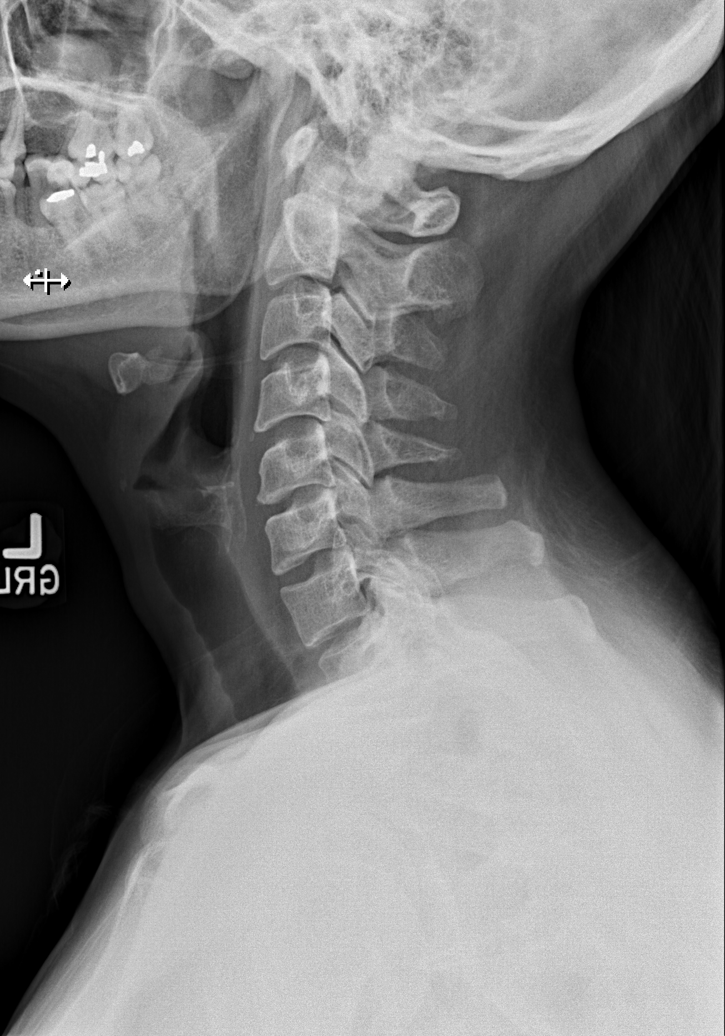
[im 2/6]
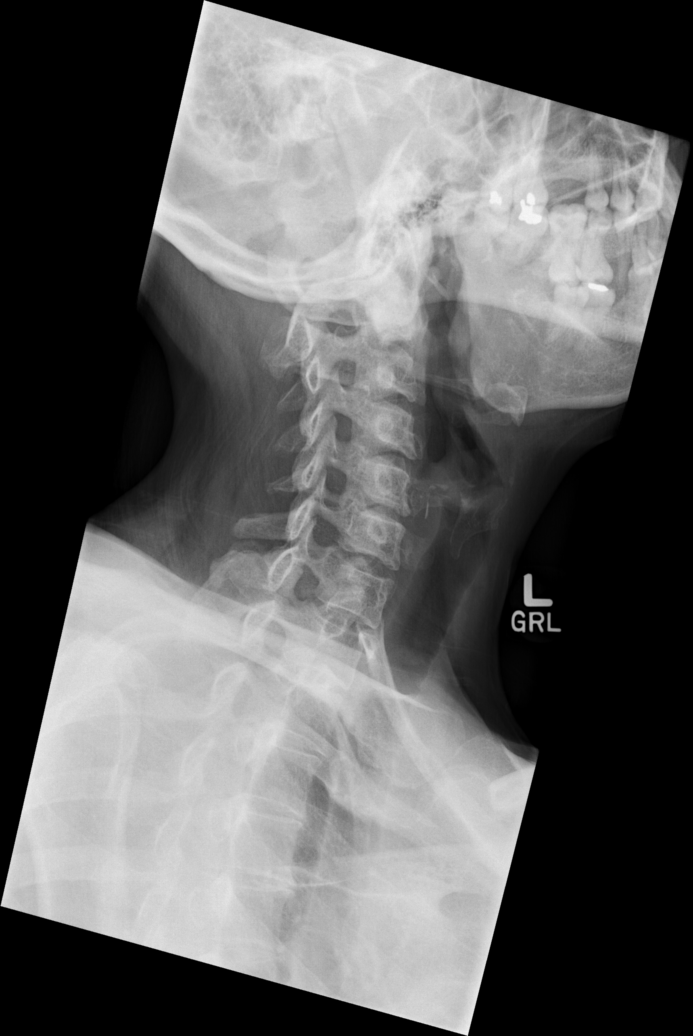
[im 3/6]
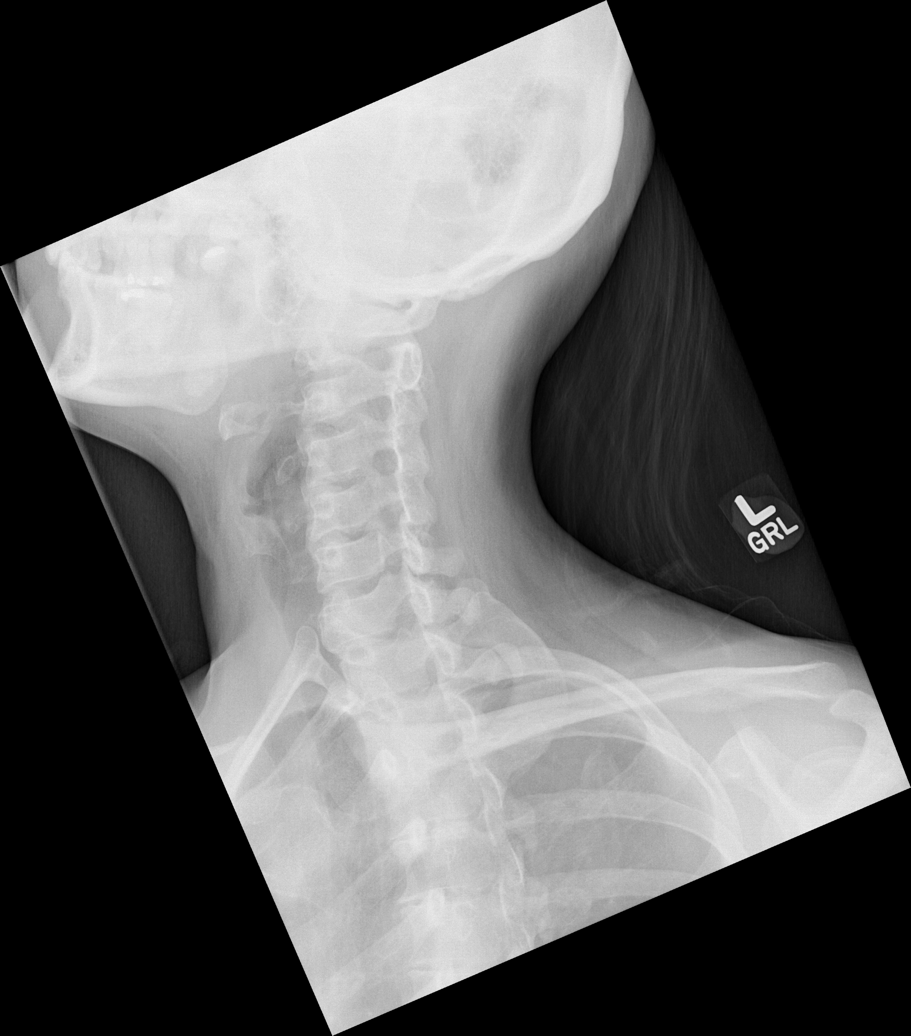
[im 4/6]
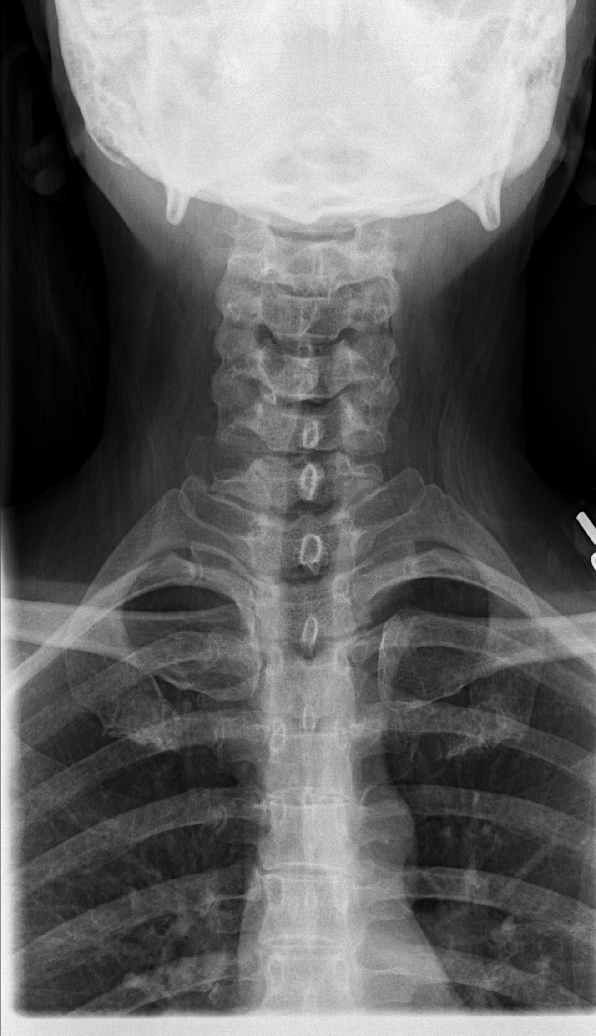
[im 5/6]
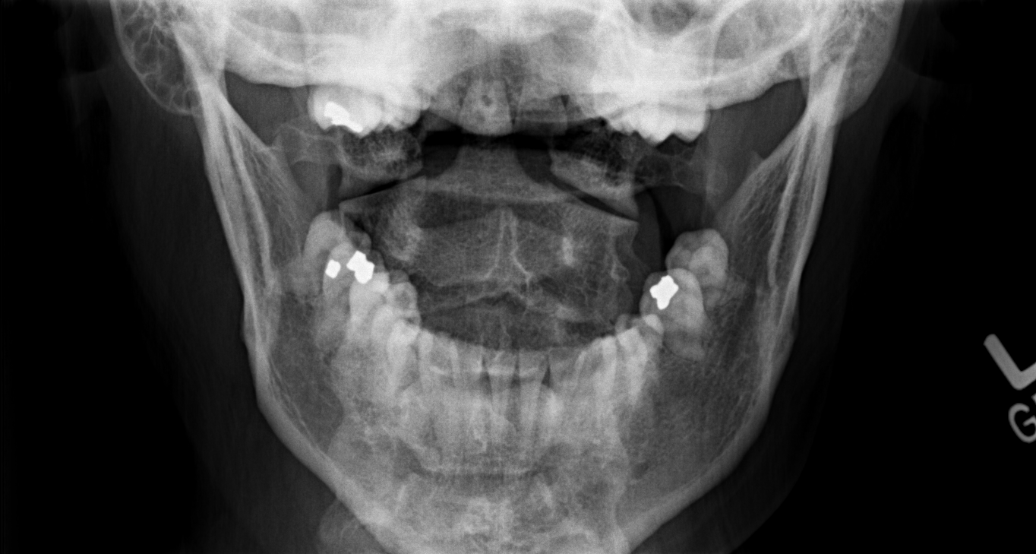
[im 6/6]
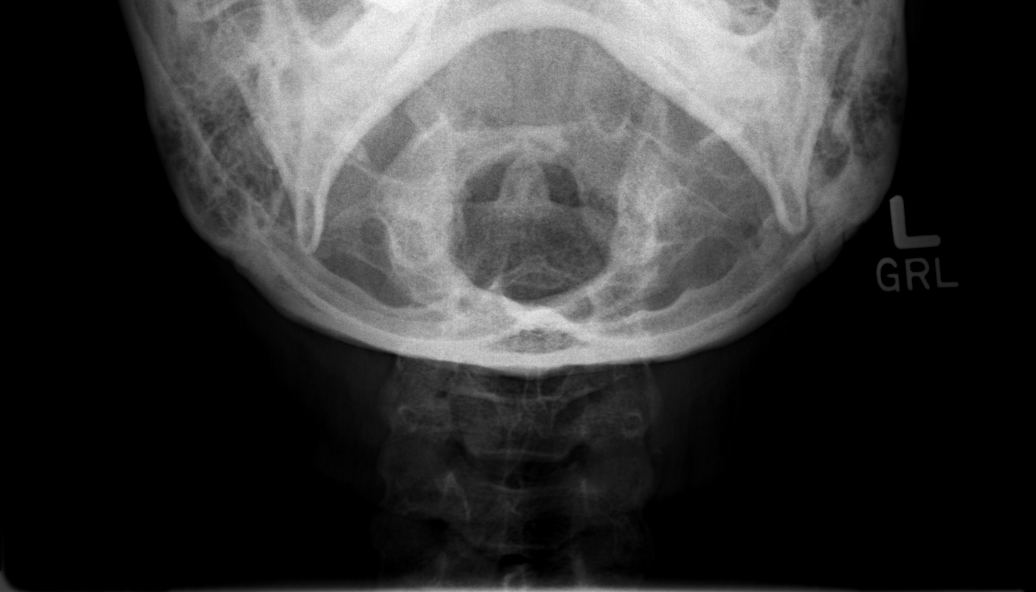

[6 of 6 positions shown; findings below may reference images not displayed]

FINDINGS: There is no evidence of cervical spine fracture or prevertebral soft
tissue swelling. Alignment is normal. No other significant bone
abnormalities are identified.
IMPRESSION: Negative cervical spine radiographs.

## 2019-07-08 ENCOUNTER — Ambulatory Visit: Payer: Self-pay | Attending: Internal Medicine

## 2019-07-08 DIAGNOSIS — Z23 Encounter for immunization: Secondary | ICD-10-CM

## 2019-07-08 NOTE — Progress Notes (Signed)
   Covid-19 Vaccination Clinic  Name:  [name]    MRN: [id number] DOB: 09/24/1970  07/08/2019  Ms. Edsel Petrin was observed post Covid-19 immunization for 15 minutes without incident. She was provided with Vaccine Information Sheet and instruction to access the V-Safe system.   Ms. Edsel Petrin was instructed to call 911 with any severe reactions post vaccine: Marland Kitchen Difficulty breathing  . Swelling of face and throat  . A fast heartbeat  . A bad rash all over body  . Dizziness and weakness   Immunizations Administered    Name Date Dose VIS Date Route   Pfizer COVID-19 Vaccine 07/08/2019  5:14 PM 0.3 mL 03/19/2019 Intramuscular   Manufacturer: [place]   Lot: [id number]   [place]: [id number]

## 2019-07-30 ENCOUNTER — Other Ambulatory Visit: Payer: Self-pay

## 2019-07-30 ENCOUNTER — Ambulatory Visit: Payer: Self-pay | Attending: Internal Medicine

## 2019-07-30 DIAGNOSIS — Z23 Encounter for immunization: Secondary | ICD-10-CM

## 2019-07-30 NOTE — Progress Notes (Signed)
   Covid-19 Vaccination Clinic  Name:  [name]    MRN: [id number] DOB: October 11, 1970  07/30/2019  Ms. Rhonda Olsen was observed post Covid-19 immunization for 15 minutes without incident. She was provided with Vaccine Information Sheet and instruction to access the V-Safe system.   Ms. Rhonda Olsen was instructed to call 911 with any severe reactions post vaccine: Marland Kitchen Difficulty breathing  . Swelling of face and throat  . A fast heartbeat  . A bad rash all over body  . Dizziness and weakness   Immunizations Administered    Name Date Dose VIS Date Route   Pfizer COVID-19 Vaccine 07/30/2019  5:49 PM 0.3 mL 06/02/2018 Intramuscular   Manufacturer: [employer]   Lot: [id number]   [place]: [id number]

## 2019-08-19 ENCOUNTER — Other Ambulatory Visit: Payer: Self-pay | Admitting: Infectious Diseases

## 2019-08-19 DIAGNOSIS — Z1231 Encounter for screening mammogram for malignant neoplasm of breast: Secondary | ICD-10-CM

## 2020-05-29 ENCOUNTER — Other Ambulatory Visit (HOSPITAL_COMMUNITY): Payer: Self-pay | Admitting: Family Medicine

## 2020-05-29 ENCOUNTER — Other Ambulatory Visit: Payer: Self-pay | Admitting: Family Medicine

## 2020-05-29 DIAGNOSIS — R1011 Right upper quadrant pain: Secondary | ICD-10-CM

## 2020-05-30 ENCOUNTER — Other Ambulatory Visit: Payer: Self-pay | Admitting: Family Medicine

## 2020-05-30 DIAGNOSIS — Z1231 Encounter for screening mammogram for malignant neoplasm of breast: Secondary | ICD-10-CM

## 2020-06-02 ENCOUNTER — Other Ambulatory Visit: Payer: Self-pay

## 2020-06-02 ENCOUNTER — Ambulatory Visit: Payer: Self-pay

## 2020-06-02 ENCOUNTER — Ambulatory Visit
Admission: RE | Admit: 2020-06-02 | Discharge: 2020-06-02 | Disposition: A | Discharge status: Home / Self Care | Payer: BC Managed Care – PPO | Source: Ambulatory Visit | Attending: Family Medicine | Admitting: Family Medicine

## 2020-06-02 DIAGNOSIS — R1011 Right upper quadrant pain: Secondary | ICD-10-CM | POA: Diagnosis present

## 2020-10-12 ENCOUNTER — Encounter: Payer: Self-pay | Admitting: *Deleted

## 2020-10-13 ENCOUNTER — Encounter
Admission: RE | Disposition: A | Discharge status: Home / Self Care | Payer: Self-pay | Source: Home / Self Care | Attending: Gastroenterology

## 2020-10-13 ENCOUNTER — Ambulatory Visit
Admission: RE | Admit: 2020-10-13 | Discharge: 2020-10-13 | Disposition: A | Discharge status: Home / Self Care | Payer: BC Managed Care – PPO | Attending: Gastroenterology | Admitting: Gastroenterology

## 2020-10-13 ENCOUNTER — Ambulatory Visit: Payer: BC Managed Care – PPO | Admitting: Anesthesiology

## 2020-10-13 ENCOUNTER — Encounter: Payer: Self-pay | Admitting: *Deleted

## 2020-10-13 DIAGNOSIS — Z87891 Personal history of nicotine dependence: Secondary | ICD-10-CM | POA: Diagnosis not present

## 2020-10-13 DIAGNOSIS — K640 First degree hemorrhoids: Secondary | ICD-10-CM | POA: Diagnosis not present

## 2020-10-13 DIAGNOSIS — Z800 Family history of malignant neoplasm of digestive organs: Secondary | ICD-10-CM | POA: Insufficient documentation

## 2020-10-13 DIAGNOSIS — R1013 Epigastric pain: Secondary | ICD-10-CM | POA: Insufficient documentation

## 2020-10-13 DIAGNOSIS — Z888 Allergy status to other drugs, medicaments and biological substances status: Secondary | ICD-10-CM | POA: Diagnosis not present

## 2020-10-13 DIAGNOSIS — Z79899 Other long term (current) drug therapy: Secondary | ICD-10-CM | POA: Diagnosis not present

## 2020-10-13 DIAGNOSIS — Z1211 Encounter for screening for malignant neoplasm of colon: Secondary | ICD-10-CM | POA: Diagnosis present

## 2020-10-13 DIAGNOSIS — D122 Benign neoplasm of ascending colon: Secondary | ICD-10-CM | POA: Insufficient documentation

## 2020-10-13 DIAGNOSIS — Z9049 Acquired absence of other specified parts of digestive tract: Secondary | ICD-10-CM | POA: Insufficient documentation

## 2020-10-13 HISTORY — PX: ESOPHAGOGASTRODUODENOSCOPY (EGD) WITH PROPOFOL: SHX5813

## 2020-10-13 HISTORY — PX: COLONOSCOPY WITH PROPOFOL: SHX5780

## 2020-10-13 HISTORY — DX: Gastro-esophageal reflux disease without esophagitis: K21.9

## 2020-10-13 SURGERY — ESOPHAGOGASTRODUODENOSCOPY (EGD) WITH PROPOFOL
Anesthesia: General

## 2020-10-13 MED ORDER — PROPOFOL 500 MG/50ML IV EMUL
INTRAVENOUS | Status: DC | PRN
  Administered 2020-10-13: 175 ug/kg/min via INTRAVENOUS

## 2020-10-13 MED ORDER — LIDOCAINE HCL (CARDIAC) PF 100 MG/5ML IV SOSY
PREFILLED_SYRINGE | INTRAVENOUS | Status: DC | PRN
  Administered 2020-10-13: 50 mg via INTRAVENOUS

## 2020-10-13 MED ORDER — PROPOFOL 500 MG/50ML IV EMUL
INTRAVENOUS | Status: AC
  Filled 2020-10-13: qty 50

## 2020-10-13 MED ORDER — SODIUM CHLORIDE 0.9 % IV SOLN
INTRAVENOUS | Status: DC
  Administered 2020-10-13: 1000 mL via INTRAVENOUS

## 2020-10-13 MED ORDER — LIDOCAINE HCL (PF) 2 % IJ SOLN
INTRAMUSCULAR | Status: AC
  Filled 2020-10-13: qty 5

## 2020-10-13 MED ORDER — PROPOFOL 10 MG/ML IV BOLUS
INTRAVENOUS | Status: DC | PRN
  Administered 2020-10-13: 60 mg via INTRAVENOUS

## 2020-10-13 NOTE — Transfer of Care (Signed)
Immediate Anesthesia Transfer of Care Note  Patient: Rhonda Olsen  Procedure(s) Performed: ESOPHAGOGASTRODUODENOSCOPY (EGD) WITH PROPOFOL COLONOSCOPY WITH PROPOFOL  Patient Location: PACU  Anesthesia Type:General  Level of Consciousness: sedated  Airway & Oxygen Therapy: Patient Spontanous Breathing  Post-op Assessment: Report given to RN and Post -op Vital signs reviewed and stable  Post vital signs: Reviewed and stable  Last Vitals:  Vitals Value Taken Time  BP    Temp    Pulse    Resp    SpO2      Last Pain:  Vitals:   10/13/20 1054  TempSrc: Temporal  PainSc: Asleep         Complications: No notable events documented.

## 2020-10-13 NOTE — H&P (Signed)
Outpatient short stay form Pre-procedure 10/13/2020 10:10 AM Raylene Miyamoto MD, MPH  Primary Physician: [facility]  Reason for visit:  Abdominal pain  History of present illness:   50 y/o lady with abdominal pain in the epigastric region. Has family history of stomach cancer in an aunt. No blood thinners. History of cholecystectomy and hysterectomy.    Current Facility-Administered Medications:    0.9 %  sodium chloride infusion, , Intravenous, Continuous, Bandon Sherwin, Hilton Cork, MD, Last Rate: 20 mL/hr at 10/13/20 0953, Continued from Pre-op at 10/13/20 0953  Medications Prior to Admission  Medication Sig Dispense Refill Last Dose   ibuprofen (ADVIL) 400 MG tablet Take 400 mg by mouth every 6 (six) hours as needed.   Past Week   ondansetron (ZOFRAN-ODT) 4 MG disintegrating tablet Take 1 tablet (4 mg total) by mouth every 8 (eight) hours as needed. 20 tablet 0 Past Week   pantoprazole (PROTONIX) 40 MG tablet Take 40 mg by mouth daily.   Past Week   sertraline (ZOLOFT) 50 MG tablet Take 50 mg by mouth.   Past Week     Allergies  Allergen Reactions   Effexor [Venlafaxine]     Abdominal pain, Headaches     Past Medical History:  Diagnosis Date   GERD (gastroesophageal reflux disease)    TB (pulmonary tuberculosis)     Review of systems:  Otherwise negative.    Physical Exam  Gen: Alert, oriented. Appears stated age.  HEENT: PERRLA. Lungs: No respiratory distress CV: RRR Abd: soft, benign, no masses Ext: No edema    Planned procedures: Proceed with EGD/colonoscopy. The patient understands the nature of the planned procedure, indications, risks, alternatives and potential complications including but not limited to bleeding, infection, perforation, damage to internal organs and possible oversedation/side effects from anesthesia. The patient agrees and gives consent to proceed.  Please refer to procedure notes for findings, recommendations and patient  disposition/instructions.     Raylene Miyamoto MD, MPH Gastroenterology 10/13/2020  10:10 AM

## 2020-10-13 NOTE — Anesthesia Procedure Notes (Signed)
Date/Time: 10/13/2020 10:20 AM Performed by: Johnna Acosta, CRNA Pre-anesthesia Checklist: Patient identified, Emergency Drugs available, Suction available, Patient being monitored and Timeout performed Patient Re-evaluated:Patient Re-evaluated prior to induction Oxygen Delivery Method: Nasal cannula Preoxygenation: Pre-oxygenation with 100% oxygen Induction Type: IV induction

## 2020-10-13 NOTE — Op Note (Signed)
[facility] Gastroenterology Patient Name: Rhonda Olsen Procedure Date: 10/13/2020 10:10 AM MRN: [id number] Account #: [id number] Date of Birth: April 26, 1970 Admit Type: Outpatient Age: 50 Room: [facility] ENDO ROOM 3 Gender: Female Note Status: Finalized Procedure:             Upper GI endoscopy Indications:           Upper abdominal pain Providers:             Andrey Farmer MD, MD Medicines:             Monitored Anesthesia Care Complications:         No immediate complications. Estimated blood loss:                         Minimal. Procedure:             Pre-Anesthesia Assessment:                        - Prior to the procedure, a History and Physical was                         performed, and patient medications and allergies were                         reviewed. The patient is competent. The risks and                         benefits of the procedure and the sedation options and                         risks were discussed with the patient. All questions                         were answered and informed consent was obtained.                         Patient identification and proposed procedure were                         verified by the physician, the nurse, the anesthetist                         and the technician in the endoscopy suite. Mental                         Status Examination: alert and oriented. Airway                         Examination: normal oropharyngeal airway and neck                         mobility. Respiratory Examination: clear to                         auscultation. CV Examination: normal. Prophylactic                         Antibiotics: The patient does not require prophylactic  antibiotics. Prior Anticoagulants: The patient has                         taken no previous anticoagulant or antiplatelet                         agents. ASA Grade Assessment: I - A normal, healthy                          patient. After reviewing the risks and benefits, the                         patient was deemed in satisfactory condition to                         undergo the procedure. The anesthesia plan was to use                         monitored anesthesia care (MAC). Immediately prior to                         administration of medications, the patient was                         re-assessed for adequacy to receive sedatives. The                         heart rate, respiratory rate, oxygen saturations,                         blood pressure, adequacy of pulmonary ventilation, and                         response to care were monitored throughout the                         procedure. The physical status of the patient was                         re-assessed after the procedure.                        After obtaining informed consent, the endoscope was                         passed under direct vision. Throughout the procedure,                         the patient's blood pressure, pulse, and oxygen                         saturations were monitored continuously. The Endoscope                         was introduced through the mouth, and advanced to the                         second part of duodenum. The upper GI endoscopy was  accomplished without difficulty. The patient tolerated                         the procedure well. Findings:      The examined esophagus was normal.      The entire examined stomach was normal. Biopsies were taken with a cold       forceps for Helicobacter pylori testing. Estimated blood loss was       minimal.      The examined duodenum was normal. Impression:            - Normal esophagus.                        - Normal stomach. Biopsied.                        - Normal examined duodenum. Recommendation:        - Perform a colonoscopy today. Procedure Code(s):     --- Professional ---                        [phone], Esophagogastroduodenoscopy,  flexible,                         transoral; with biopsy, single or multiple Diagnosis Code(s):     --- Professional ---                        R10.10, Upper abdominal pain, unspecified CPT copyright 2019 American Medical Association. All rights reserved. The codes documented in this report are preliminary and upon coder review may  be revised to meet current compliance requirements. Andrey Farmer MD, MD 10/13/2020 10:51:43 AM Number of Addenda: 0 Note Initiated On: 10/13/2020 10:10 AM Estimated Blood Loss:  Estimated blood loss was minimal.      [facility]

## 2020-10-13 NOTE — Op Note (Signed)
[facility] Gastroenterology Patient Name: Rhonda Olsen Procedure Date: 10/13/2020 10:10 AM MRN: [id number] Account #: [id number] Date of Birth: 1970/08/18 Admit Type: Outpatient Age: 50 Room: [facility] ENDO ROOM 3 Gender: Female Note Status: Finalized Procedure:             Colonoscopy Indications:           Screening for colorectal malignant neoplasm Providers:             Andrey Farmer MD, MD Medicines:             Monitored Anesthesia Care Complications:         No immediate complications. Estimated blood loss:                         Minimal. Procedure:             Pre-Anesthesia Assessment:                        - Prior to the procedure, a History and Physical was                         performed, and patient medications and allergies were                         reviewed. The patient is competent. The risks and                         benefits of the procedure and the sedation options and                         risks were discussed with the patient. All questions                         were answered and informed consent was obtained.                         Patient identification and proposed procedure were                         verified by the physician, the nurse, the anesthetist                         and the technician in the endoscopy suite. Mental                         Status Examination: alert and oriented. Airway                         Examination: normal oropharyngeal airway and neck                         mobility. Respiratory Examination: clear to                         auscultation. CV Examination: normal. Prophylactic                         Antibiotics: The patient does not require prophylactic  antibiotics. Prior Anticoagulants: The patient has                         taken no previous anticoagulant or antiplatelet                         agents. ASA Grade Assessment: I - A normal, healthy                          patient. After reviewing the risks and benefits, the                         patient was deemed in satisfactory condition to                         undergo the procedure. The anesthesia plan was to use                         monitored anesthesia care (MAC). Immediately prior to                         administration of medications, the patient was                         re-assessed for adequacy to receive sedatives. The                         heart rate, respiratory rate, oxygen saturations,                         blood pressure, adequacy of pulmonary ventilation, and                         response to care were monitored throughout the                         procedure. The physical status of the patient was                         re-assessed after the procedure.                        After obtaining informed consent, the colonoscope was                         passed under direct vision. Throughout the procedure,                         the patient's blood pressure, pulse, and oxygen                         saturations were monitored continuously. The                         Colonoscope was introduced through the anus and                         advanced to the the terminal ileum. The colonoscopy  was performed without difficulty. The patient                         tolerated the procedure well. The quality of the bowel                         preparation was good. Findings:      The perianal and digital rectal examinations were normal.      The terminal ileum appeared normal.      A 1 mm polyp was found in the ascending colon. The polyp was sessile.       The polyp was removed with a jumbo cold forceps. Resection and retrieval       were complete. Estimated blood loss was minimal.      Internal hemorrhoids were found during retroflexion. The hemorrhoids       were Grade I (internal hemorrhoids that do not prolapse).      The exam was  otherwise without abnormality on direct and retroflexion       views. Impression:            - The examined portion of the ileum was normal.                        - One 1 mm polyp in the ascending colon, removed with                         a jumbo cold forceps. Resected and retrieved.                        - Internal hemorrhoids.                        - The examination was otherwise normal on direct and                         retroflexion views. Recommendation:        - Discharge patient to home.                        - Resume previous diet.                        - Continue present medications.                        - Await pathology results.                        - Repeat colonoscopy for surveillance based on                         pathology results.                        - Return to referring physician as previously                         scheduled. Procedure Code(s):     --- Professional ---                        [phone], Colonoscopy,  flexible; with biopsy, single or                         multiple Diagnosis Code(s):     --- Professional ---                        Z12.11, Encounter for screening for malignant neoplasm                         of colon                        K64.0, First degree hemorrhoids                        K63.5, Polyp of colon CPT copyright 2019 American Medical Association. All rights reserved. The codes documented in this report are preliminary and upon coder review may  be revised to meet current compliance requirements. Andrey Farmer MD, MD 10/13/2020 10:54:23 AM Number of Addenda: 0 Note Initiated On: 10/13/2020 10:10 AM Scope Withdrawal Time: 0 hours 10 minutes 38 seconds  Total Procedure Duration: 0 hours 14 minutes 49 seconds  Estimated Blood Loss:  Estimated blood loss was minimal.      [facility]

## 2020-10-13 NOTE — Interval H&P Note (Signed)
History and Physical Interval Note:  10/13/2020 10:13 AM  Rhonda Olsen Rhonda Olsen  has presented today for surgery, with the diagnosis of SCREENING GERD.  The various methods of treatment have been discussed with the patient and family. After consideration of risks, benefits and other options for treatment, the patient has consented to  Procedure(s) with comments: ESOPHAGOGASTRODUODENOSCOPY (EGD) WITH PROPOFOL (N/A) - SPANISH INTERPRETER COLONOSCOPY WITH PROPOFOL (N/A) as a surgical intervention.  The patient's history has been reviewed, patient examined, no change in status, stable for surgery.  I have reviewed the patient's chart and labs.  Questions were answered to the patient's satisfaction.     Lesly Rubenstein  Ok to proceed with EGD/Colonoscopy

## 2020-10-13 NOTE — Anesthesia Preprocedure Evaluation (Signed)
Anesthesia Evaluation  Patient identified by MRN, date of birth, ID band Patient awake    Reviewed: Allergy & Precautions, NPO status , Patient's Chart, lab work & pertinent test results  History of Anesthesia Complications Negative for: history of anesthetic complications  Airway Mallampati: III  TM Distance: >3 FB Neck ROM: full    Dental  (+) Chipped   Pulmonary neg shortness of breath, former smoker,    Pulmonary exam normal        Cardiovascular Exercise Tolerance: Good (-) Past MI negative cardio ROS Normal cardiovascular exam     Neuro/Psych negative neurological ROS  negative psych ROS   GI/Hepatic Neg liver ROS, GERD  Medicated and Controlled,  Endo/Other  negative endocrine ROS  Renal/GU negative Renal ROS  negative genitourinary   Musculoskeletal  (+) Arthritis ,   Abdominal   Peds  Hematology negative hematology ROS (+)   Anesthesia Other Findings Past Medical History: No date: GERD (gastroesophageal reflux disease) No date: TB (pulmonary tuberculosis)  Past Surgical History: No date: ABDOMINAL HYSTERECTOMY No date: CHOLECYSTECTOMY  BMI    Body Mass Index: 26.94 kg/m      Reproductive/Obstetrics negative OB ROS                             Anesthesia Physical Anesthesia Plan  ASA: 2  Anesthesia Plan: General   Post-op Pain Management:    Induction: Intravenous  PONV Risk Score and Plan: Propofol infusion and TIVA  Airway Management Planned: Natural Airway and Nasal Cannula  Additional Equipment:   Intra-op Plan:   Post-operative Plan:   Informed Consent: I have reviewed the patients History and Physical, chart, labs and discussed the procedure including the risks, benefits and alternatives for the proposed anesthesia with the patient or authorized representative who has indicated his/her understanding and acceptance.     Dental Advisory  Given  Plan Discussed with: Anesthesiologist, CRNA and Surgeon  Anesthesia Plan Comments: (Patient consented for risks of anesthesia including but not limited to:  - adverse reactions to medications - risk of airway placement if required - damage to eyes, teeth, lips or other oral mucosa - nerve damage due to positioning  - sore throat or hoarseness - Damage to heart, brain, nerves, lungs, other parts of body or loss of life  Patient voiced understanding.)        Anesthesia Quick Evaluation

## 2020-10-13 NOTE — Anesthesia Postprocedure Evaluation (Signed)
Anesthesia Post Note  Patient: Edelin Fryer  Procedure(s) Performed: ESOPHAGOGASTRODUODENOSCOPY (EGD) WITH PROPOFOL COLONOSCOPY WITH PROPOFOL  Patient location during evaluation: Endoscopy Anesthesia Type: General Level of consciousness: awake and alert Pain management: pain level controlled Vital Signs Assessment: post-procedure vital signs reviewed and stable Respiratory status: spontaneous breathing, nonlabored ventilation, respiratory function stable and patient connected to nasal cannula oxygen Cardiovascular status: blood pressure returned to baseline and stable Postop Assessment: no apparent nausea or vomiting Anesthetic complications: no   No notable events documented.   Last Vitals:  Vitals:   10/13/20 1114 10/13/20 1124  BP: 111/68 118/76  Pulse: 65 (!) 54  Resp: 13 12  Temp:    SpO2: 100% 100%    Last Pain:  Vitals:   10/13/20 1124  TempSrc:   PainSc: 0-No pain                 Precious Haws Kamoni Depree

## 2020-10-16 ENCOUNTER — Encounter: Payer: Self-pay | Admitting: Gastroenterology

## 2020-10-16 LAB — SURGICAL PATHOLOGY

## 2021-03-31 IMAGING — DX PORTABLE CHEST - 1 VIEW
1 series · 1 of 1 positions shown · non-contrast
Comparison: None.

CLINICAL DATA: Headaches and shortness of breath for 2 days, recent
exposure to coworkers with [id number] positivity

EXAM:
PORTABLE CHEST 1 VIEW

[chest ap]
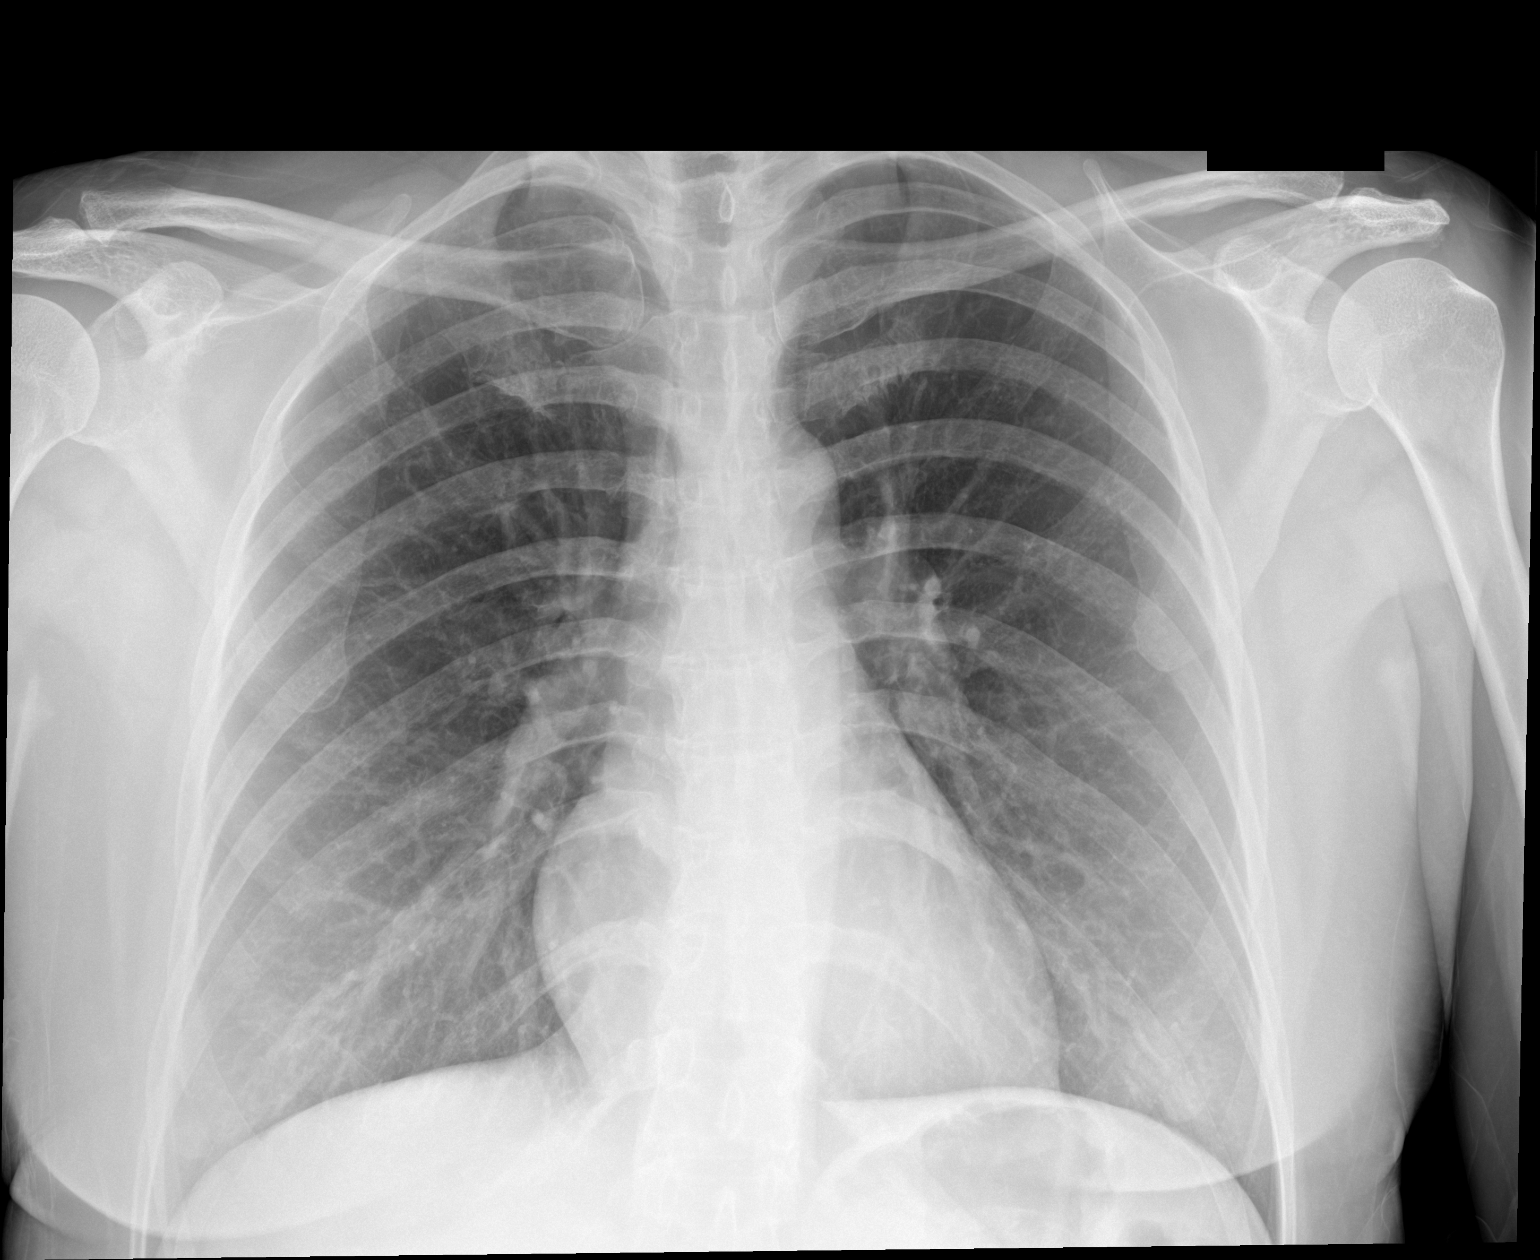

[1 of 1 positions shown; findings below may reference images not displayed]

FINDINGS: The heart size and mediastinal contours are within normal limits.
Both lungs are clear. The visualized skeletal structures are
unremarkable.
IMPRESSION: No active disease.

## 2021-09-06 ENCOUNTER — Other Ambulatory Visit: Payer: Self-pay | Admitting: Family Medicine

## 2021-09-06 DIAGNOSIS — Z1231 Encounter for screening mammogram for malignant neoplasm of breast: Secondary | ICD-10-CM

## 2021-10-10 ENCOUNTER — Ambulatory Visit
Admission: RE | Admit: 2021-10-10 | Discharge: 2021-10-10 | Disposition: A | Discharge status: Home / Self Care | Payer: BC Managed Care – PPO | Source: Ambulatory Visit | Attending: Family Medicine | Admitting: Family Medicine

## 2021-10-10 DIAGNOSIS — Z1231 Encounter for screening mammogram for malignant neoplasm of breast: Secondary | ICD-10-CM | POA: Insufficient documentation

## 2022-01-22 ENCOUNTER — Ambulatory Visit: Payer: BC Managed Care – PPO | Attending: Obstetrics and Gynecology

## 2022-01-22 DIAGNOSIS — M6281 Muscle weakness (generalized): Secondary | ICD-10-CM | POA: Insufficient documentation

## 2022-01-22 DIAGNOSIS — M6289 Other specified disorders of muscle: Secondary | ICD-10-CM | POA: Insufficient documentation

## 2022-01-22 DIAGNOSIS — R278 Other lack of coordination: Secondary | ICD-10-CM | POA: Insufficient documentation

## 2022-01-22 NOTE — Therapy (Signed)
OUTPATIENT PHYSICAL THERAPY FEMALE PELVIC EVALUATION   Patient Name: Rhonda Olsen MRN: [id number] DOB:[birth year], 51 y.o., female Today's Date: 01/22/2022   PT End of Session - 01/22/22 1437     Visit Number 1    Number of Visits 10    Date for PT Re-Evaluation 04/02/22    Authorization Type IE: 01/22/22    PT Start Time 1440    PT Stop Time 1520    PT Time Calculation (min) 40 min    Activity Tolerance Patient tolerated treatment well             Past Medical History:  Diagnosis Date   GERD (gastroesophageal reflux disease)    TB (pulmonary tuberculosis)    Past Surgical History:  Procedure Laterality Date   ABDOMINAL HYSTERECTOMY     CHOLECYSTECTOMY     COLONOSCOPY WITH PROPOFOL N/A 10/13/2020   Procedure: COLONOSCOPY WITH PROPOFOL;  Surgeon: Lesly Rubenstein, MD;  Location: [facility] ENDOSCOPY;  Service: Endoscopy;  Laterality: N/A;   ESOPHAGOGASTRODUODENOSCOPY (EGD) WITH PROPOFOL N/A 10/13/2020   Procedure: ESOPHAGOGASTRODUODENOSCOPY (EGD) WITH PROPOFOL;  Surgeon: Lesly Rubenstein, MD;  Location: [facility] ENDOSCOPY;  Service: Endoscopy;  Laterality: N/A;  [place] INTERPRETER   Patient Active Problem List   Diagnosis Date Noted   Chronic female pelvic pain 02/19/2013   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 Unexplained weight loss? Saddle anesthesia? Unexplained changes in bowel or bladder habits?   SUBJECTIVE: Patient confirms  identification and approves PT to assess pelvic floor and treatment Yes                                                                                                                                                                                           PRECAUTIONS: None  WEIGHT BEARING RESTRICTIONS: No  FALLS:  Has patient fallen in last 6 months? 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 Pt has noticed more when playing with her grandkids. Pt also has a small trampoline for physical activity and now has leakage and unable to continue with those exercises. Pt had a recent UTI and finishing antibiotics. After discussion, Pt continues to have some sxs of UTI. DPT stated to reach out to provider if not resolved fully by Friday. Pt verbalized understanding.    PAIN:  Are you having pain? No    PATIENT GOALS: Pt would like to control her urinary leakage and prevent worsening    UROLOGICAL HISTORY Fluid intake: water, coffee (up to 2 cups per day), tea   Pain with urination: No Fully empty bladder: Yes Stream: Strong Urgency: Yes  Toileting posture: heels raised with BMs Frequency: 8x, maybe every hour  Nocturia: 0-1x Leakage: Coughing, Sneezing, Laughing, and Exercise Pads: No Type: will change undergarments very occasionally but does have to cross legs and squeeze gluteals to hold urine Amount:    GASTROINTESTINAL HISTORY Pain with bowel movement: No Type of bowel movement:Type (Bristol Stool Scale) 3 and Strain Yes, occasionally Frequency: 1x/day Fully empty rectum: Yes Leakage:  No   SEXUAL HISTORY/FUNCTION Pain with intercourse: Initial Penetration, During Penetration, and After Intercourse Ability to have vaginal penetration:  Yes: but occasionally with pain ; Deep thrusting:  Able to achieve orgasm?: Yes  OBSTETRICAL HISTORY Vaginal deliveries: G2P2  Tearing: episiotomy with one  C-section deliveries: no   GYNECOLOGICAL HISTORY Hysterectomy: yes, hysterectomy abdominal  Pelvic Organ Prolapse: None Pain with exam: no  Heaviness/pressure: no   OBJECTIVE:    COGNITION: Overall cognitive status: Within functional limits for tasks assessed     POSTURE:  Grossly adducted B hips (increase PFM tension) and B plantarflexion Lumbar lordosis:   Thoracic kyphosis: Deferred 2/2 time constraints Iliac crest height:  Lumbar lateral shift:  Pelvic obliquity:  Leg length discrepancy:   GAIT: Deferred 2/2 time constraints Distance walked:  Assistive device utilized:  Level of assistance:  Comments:   Trendelenburg:   SENSATION: Deferred 2/2 time constraints Light touch: , L2-S2 dermatomes  Proprioception:    RANGE OF MOTION:  Deferred 2/2 time constraints  (Norm range in degrees)  LEFT  RIGHT   Lumbar forward flexion (65):      Lumbar extension (30):     Lumbar lateral flexion (25):     Thoracic and Lumbar rotation (30 degrees):       Hip Flexion (0-125):      Hip IR (0-45):     Hip ER (0-45):     Hip Adduction:      Hip Abduction (0-40):     Hip extension (0-15):     (*= pain, Blank rows = not tested)   STRENGTH: MMT  Deferred 2/2 time constraints  RLE  LLE   Hip Flexion    Hip Extension    Hip Abduction     Hip Adduction     Hip ER     Hip IR     Knee Extension    Knee Flexion    Dorsiflexion     Plantarflexion (seated)    (*= pain, Blank rows = not tested)   SPECIAL TESTS: Deferred 2/2 time constraints Centralization and Peripheralization (SN 92, -LR 0.12):  Slump (SN 83, -LR 0.32):  SLR (SN 92, -LR 0.29): R: Lumbar  quadrant (SN 70): R:  FABER (SN 81): FADIR (SN 94):  Hip scour (SN 50):  Thigh Thrust (  SN 88, -LR 0.18) : Distraction (HQ46):  Compression (SN/SP 69): Stork/March (SP 93):   PALPATION: Deferred 2/2 time constraints Abdominal:  Diastasis:  finger above umbilicus,  fingers at and below umbilicus  Scar mobility: present/mobile perpendicular, parallel Rib flare: present/absent  EXTERNAL PELVIC EXAM: Patient educated on the purpose of the pelvic exam and articulated understanding; patient consented to the exam verbally. Palpation: Breath coordination: present/absent/inconsistent Voluntary Contraction: present/absent Relaxation: full/delayed/non-relaxing Perineal movement with sustained IAP increase ("bear down"): descent/no change/elevation/excessive descent Perineal movement with rapid IAP increase ("cough"): elevation/no change/descent Pubic symphysis: (0= no contraction, 1= flicker, 2= weak squeeze, 3= fair squeeze with lift, 4= good squeeze and lift against resistance, 5= strong squeeze against strong resistance)   INTERNAL PELVIC EXAM: Patient educated on the purpose of the pelvic exam and articulated understanding; patient consented to the exam verbally. Deferred 2/2 to time constraints Introitus Appears:  Skin integrity:  Scar mobility: Strength (PERF):  Symmetry: Palpation: Prolapse: (0= no contraction, 1= flicker, 2= weak squeeze, 3= fair squeeze with lift, 4= good squeeze and lift against resistance, 5= strong squeeze against strong resistance)     Patient Education:  Patient educated on what to expect during course of physical therapy, POC, and provided with HEP including: toileting posture handout and discussion on vaginal moisturizer sheet. Patient verbalized understanding and returned demonstration. Patient will benefit from further education in order to maximize compliance and understanding for long-term therapeutic gains.  Patient educated throughout session on  appropriate technique and form using multi-modal cueing, HEP, and activity modification.   Patient Surveys:  FOTO Urinary Problem - 59     ASSESSMENT:  Clinical Impression: Patient is a 52 y.o. who was seen today for physical therapy evaluation and treatment for a chief concern of urinary leakage. Today's evaluation suggest deficits in IAP management, PFM coordination, PFM strength, PFM extensibility, posture, pain, and scar mobility as evidenced by urinary leakage with coughing/sneezing/bending/jumping/laughing, occasional straining to have a BM, in seated with adducted hips and B plantarflexion (increased PFM tension), urinary frequency (can be every hour), having to adduct hips and squeeze gluteals with sneezing/coughing, pain with intercourse, and hx of an episiotomy. Patient's responses on FOTO Urinary Problem (59) indicates moderate limitation/disability/distress. Patient's progress may be limited due to time since onset; however, patient's motivation is advantageous. Pt with basic understanding of PFM function in bowel/bladder, sexual function, scar mobility, and the deep core. Patient will benefit from skilled therapeutic intervention to address deficits in IAP management, PFM coordination, PFM strength, PFM extensibility, posture, pain, and scar mobility in order to increase PLOF and improve overall QOL.    Objective Impairments: decreased activity tolerance, decreased coordination, decreased strength, increased fascial restrictions, postural dysfunction, and pain.   Activity Limitations: bending, continence, toileting, and locomotion level  Personal Factors: Age, Behavior pattern, Past/current experiences, Time since onset of injury/illness/exacerbation, and 1 comorbidity: GERD  are also affecting patient's functional outcome.   Rehab Potential: Good  Clinical Decision Making: Evolving/moderate complexity  Evaluation Complexity: Moderate   GOALS: Goals reviewed with patient?  Yes  SHORT TERM GOALS: Target date: 02/26/2022  Patient will demonstrate independence with HEP in order to maximize therapeutic gains and improve carryover from physical therapy sessions to ADLs in the home and community. Baseline: toileting posture handout Goal status: INITIAL    LONG TERM GOALS: Target date: 04/02/2022   Patient will demonstrate circumferential and sequential contraction of >3/5 MMT, > 5 sec hold x5 and 5 consecutive quick flicks with </= 10 min rest between testing  bouts, and relaxation of the PFM coordinated with breath for improved management of intra-abdominal pressure and normal bowel and bladder function without the presence of pain nor incontinence in order to improve participation at home and in the community. Baseline: will assess next visit  Goal status: INITIAL  2.  Patient will demonstrate coordinated lengthening and relaxation of PFM with diaphragmatic inhalation in order to decrease spasm and allow for unrestricted elimination of urine/feces for improved overall QOL. Baseline: occasional straining to have a BM with heels raised  Goal status: INITIAL  3.  Patient will report less than 5 incidents of stress urinary incontinence or present with bodily compensations (adducted hips and Valsalva) over the course of 3 weeks while coughing/sneezing/laughing/jumping in order to demonstrate improved PFM coordination, strength, and function for improved overall QOL. Baseline: urinary leakage at times with all the above but having to compensate every time with all the above  Goal status: INITIAL  4.  Patient will report being able to return to activities including, but not limited to: intercourse, physical activity, interacting with grandkids outdoors without pain or limitation to indicate complete resolution of the chief concern and return to prior level of participation at home and in the community. Baseline: has pain with intercourse, limited to activity due to  urinary leakage Goal status: INITIAL  5.  Patient will score >/= 68 on FOTO Urinary Problem  in order to demonstrate improved IAP management, improved PFM coordination, and overall QOL.  Baseline: 59 Goal status: INITIAL    PLAN: PT Frequency: 1x/week  PT Duration: 10 weeks  Planned Interventions: Therapeutic exercises, Therapeutic activity, Neuromuscular re-education, Balance training, Gait training, Patient/Family education, Self Care, Joint mobilization, Spinal mobilization, Cryotherapy, Moist heat, scar mobilization, Taping, and Manual therapy  Plan For Next Session: phy assess, give moisturizer/toilet posture, talk about scar mobs   Teodor Prater, PT, DPT  01/22/2022, 4:38 PM

## 2022-01-29 ENCOUNTER — Ambulatory Visit: Payer: BC Managed Care – PPO

## 2022-02-01 ENCOUNTER — Ambulatory Visit: Payer: BC Managed Care – PPO

## 2022-02-05 ENCOUNTER — Ambulatory Visit: Payer: BC Managed Care – PPO

## 2022-02-05 DIAGNOSIS — M6289 Other specified disorders of muscle: Secondary | ICD-10-CM | POA: Diagnosis not present

## 2022-02-05 DIAGNOSIS — R278 Other lack of coordination: Secondary | ICD-10-CM

## 2022-02-05 DIAGNOSIS — M6281 Muscle weakness (generalized): Secondary | ICD-10-CM

## 2022-02-05 NOTE — Therapy (Addendum)
OUTPATIENT PHYSICAL THERAPY FEMALE PELVIC TREATMENT   Patient Name: Rhonda Olsen MRN: [id number] DOB:Sep 03, 1970, 51 y.o., female Today's Date: 02/05/2022   PT End of Session - 02/05/22 1148     Visit Number 2    Number of Visits 10    Date for PT Re-Evaluation 04/02/22    Authorization Type IE: 01/22/22    PT Start Time 1150    PT Stop Time 1225    PT Time Calculation (min) 35 min    Activity Tolerance Patient tolerated treatment well             Past Medical History:  Diagnosis Date   GERD (gastroesophageal reflux disease)    TB (pulmonary tuberculosis)    Past Surgical History:  Procedure Laterality Date   ABDOMINAL HYSTERECTOMY     CHOLECYSTECTOMY     COLONOSCOPY WITH PROPOFOL N/A 10/13/2020   Procedure: COLONOSCOPY WITH PROPOFOL;  Surgeon: Lesly Rubenstein, MD;  Location: [facility];  Service: Endoscopy;  Laterality: N/A;   ESOPHAGOGASTRODUODENOSCOPY (EGD) WITH PROPOFOL N/A 10/13/2020   Procedure: ESOPHAGOGASTRODUODENOSCOPY (EGD) WITH PROPOFOL;  Surgeon: Lesly Rubenstein, MD;  Location: [facility];  Service: Endoscopy;  Laterality: N/A;  [place] INTERPRETER   Patient Active Problem List   Diagnosis Date Noted   Chronic female pelvic pain 02/19/2013   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 Pt has noticed more when playing with her grandkids. Pt also has a small trampoline for physical activity and now has leakage and unable to continue with those exercises. Pt had a recent UTI and finishing antibiotics. After discussion, Pt continues to have some sxs of UTI. DPT stated to reach out to provider if not resolved fully by Friday. Pt verbalized understanding.   Are you having pain? No    PATIENT GOALS: Pt would like to control her urinary leakage and prevent worsening    UROLOGICAL HISTORY Fluid intake: water, coffee (up to 2 cups per day), tea   Pain with urination: No Fully empty bladder: Yes Stream: Strong Urgency: Yes  Toileting posture: heels raised with BMs Frequency: 8x, maybe every hour  Nocturia: 0-1x Leakage: Coughing, Sneezing, Laughing, and Exercise Pads: No Type: will change undergarments very occasionally but does have to cross legs and squeeze gluteals to hold urine Amount:    GASTROINTESTINAL HISTORY Pain with bowel movement: No Type of bowel movement:Type (Bristol Stool Scale) 3 and Strain Yes, occasionally Frequency: 1x/day Fully empty rectum: Yes Leakage: No   SEXUAL HISTORY/FUNCTION Pain with intercourse: Initial Penetration, During Penetration, and After Intercourse Ability to have vaginal penetration:  Yes: but occasionally with pain ; Deep thrusting:  Able to achieve orgasm?: Yes  OBSTETRICAL HISTORY Vaginal deliveries: G2P2  Tearing: episiotomy with one  C-section deliveries: no   GYNECOLOGICAL HISTORY Hysterectomy: yes, hysterectomy abdominal  Pelvic Organ Prolapse: None Pain with exam: no  Heaviness/pressure: no   SUBJECTIVE:  Pt arrived a few mins after scheduled time. Patient  has no questions from initial evaluation.                                                                                                                                                                       PAIN:   No   TODAY'S TREATMENT  Neuromuscular Re-education:  Pre-treatment assessment:  OBJECTIVE:    COGNITION: Overall cognitive status: Within functional limits for tasks assessed     POSTURE:   Iliac crest height: WNL Pelvic obliquity: WNL   RANGE OF MOTION:    (Norm range in degrees)  LEFT 02/05/22 RIGHT 02/05/22  Lumbar forward flexion (65):  WNL    Lumbar extension (30): WNL    Lumbar lateral flexion (25):  WNL WNL  Thoracic and Lumbar rotation (30 degrees):    WNL WNL  Hip Flexion (0-125):   WNL WNL  Hip IR (0-45):  WNL WNL  Hip ER (0-45):  WNL WNL  Hip Adduction:      Hip Abduction (0-40):  WNL WNL  Hip extension (0-15):     (*= pain, Blank rows = not tested)   STRENGTH: MMT    RLE 02/05/22 LLE 02/05/22  Hip Flexion 4 4  Hip Extension    Hip Abduction     Hip Adduction     Hip ER  5 5  Hip IR  5 5  Knee Extension 5 5  Knee Flexion 5 5  Dorsiflexion     Plantarflexion (seated) 5 5  (*= pain, Blank rows = not tested)   SPECIAL TESTS:   FABER (SN 81): negative B FADIR (SN 94): negative B    PALPATION:  Abdominal:  Diastasis: will assess next visit  Scar mobility: present, Hysterectomy scar above pubic bone, significant scar restriction above and at the scar with numbness - tender and occasional sharp pain (not during session)   -Palpation: Myofascial tension upon palpation along the center of the abdomen and the lower abdomen in the center  Tenderness at lower abdomen   EXTERNAL PELVIC EXAM: Patient educated on the purpose of the pelvic exam and articulated understanding; patient consented to the exam verbally. Deferred 2/2 time constraints Palpation: Breath coordination: present/absent/inconsistent Voluntary Contraction:  present/absent Relaxation: full/delayed/non-relaxing Perineal movement with sustained IAP increase ("bear down"): descent/no change/elevation/excessive descent Perineal movement with rapid IAP increase ("cough"): elevation/no change/descent Pubic symphysis: (0= no contraction, 1= flicker, 2= weak squeeze, 3= fair squeeze with lift, 4= good squeeze and lift against resistance, 5= strong squeeze against strong resistance)  Manual Therapy: Abdominal myofascial release for improved fascial sling mobility and pain modulation  Myofascial tension upon palpation along the center of the abdomen and the lower abdomen in the center  Improved with increased time   Scar mobilization in circular/vertical/horizontal motions for improved tissue extensibility  Hysterectomy scar above pubic bone, significant scar restriction above and at the scar with numbness   Neuromuscular Re-education: Discussion and demonstration via visual/video on relationship between the diaphragm and the pelvic floor muscles. In addition, discussion on IAP and how the deep core is involved. Pt verbalized understanding.   Discussion on natural vaginal moisturizers for internal and external use.   Discussion and handout given on scar mobilization to begin practicing as part of HEP. Pt verbalized understanding.   Patient response to interventions: Pt comfortable to perform scar mobs at home   Patient Education:  Patient educated on what to expect during course of physical therapy, POC, and provided with HEP including: vaginal moisturizer sheet, scar mobs handout. Patient educated throughout session on appropriate technique and form using multi-modal cueing, HEP, and activity modification. Patient will benefit from further education in order to maximize compliance and understanding for long-term therapeutic gains.      ASSESSMENT:  Clinical Impression: Patient presents to clinic with excellent motivation to participate in  today's session. Upon physical assessment, Pt demonstrates deficits in IAP management, LE strength, pain, and scar mobility as evidenced by 4/5 MMT with B hip flexion and bodily compensation of posterior lean, increased tenderness upon palpation of the center of the abdomen and at the center lower quadrant (myofascial tension), report of intermittent sharp pain in the lower abdominals, and significant scar restriction at lower abdominal hysterectomy scar (above and at the scar) with numbness noted per Pt. PFM external assessment will take place next session. Discussion and use of visuals/video on the relationship between the PFM and the diaphragm as well as IAP. Pt verbalized understanding. Patient responded positively to manual, active, and educational interventions. Patient will continue to benefit from skilled therapeutic intervention to address deficits in IAP management, PFM coordination, PFM strength, PFM extensibility, LE strength, pain, and scar mobility in order to increase PLOF and improve overall QOL.    Objective Impairments: decreased activity tolerance, decreased coordination, decreased strength, increased fascial restrictions, postural dysfunction, and pain.   Activity Limitations: bending, continence, toileting, and locomotion level  Personal Factors: Age, Behavior pattern, Past/current experiences, Time since onset of injury/illness/exacerbation, and 1 comorbidity: GERD  are also affecting patient's functional outcome.   Rehab Potential: Good  Clinical Decision Making: Evolving/moderate complexity  Evaluation Complexity: Moderate   GOALS: Goals reviewed with patient? Yes  SHORT TERM GOALS: Target date: 02/26/2022  Patient will demonstrate independence with HEP in order to maximize therapeutic gains and improve carryover from physical therapy sessions to ADLs in the home and community. Baseline: toileting posture handout Goal status: INITIAL    LONG TERM GOALS: Target date:  04/02/2022   Patient will demonstrate circumferential and sequential contraction of >3/5 MMT, > 5 sec hold x5 and 5 consecutive quick flicks with </= 10 min rest between testing bouts, and relaxation of the PFM coordinated with breath for improved management of intra-abdominal pressure and normal bowel and bladder function without the presence of pain nor incontinence in order to improve participation at home and in the community. Baseline: will assess next visit  Goal status: INITIAL  2.  Patient will demonstrate coordinated lengthening and relaxation of PFM with diaphragmatic inhalation in order to decrease spasm and  allow for unrestricted elimination of urine/feces for improved overall QOL. Baseline: occasional straining to have a BM with heels raised  Goal status: INITIAL  3.  Patient will report less than 5 incidents of stress urinary incontinence or present with bodily compensations (adducted hips and Valsalva) over the course of 3 weeks while coughing/sneezing/laughing/jumping in order to demonstrate improved PFM coordination, strength, and function for improved overall QOL. Baseline: urinary leakage at times with all the above but having to compensate every time with all the above  Goal status: INITIAL  4.  Patient will report being able to return to activities including, but not limited to: intercourse, physical activity, interacting with grandkids outdoors without pain or limitation to indicate complete resolution of the chief concern and return to prior level of participation at home and in the community. Baseline: has pain with intercourse, limited to activity due to urinary leakage Goal status: INITIAL  5.  Patient will score >/= 68 on FOTO Urinary Problem  in order to demonstrate improved IAP management, improved PFM coordination, and overall QOL.  Baseline: 59 Goal status: INITIAL    PLAN: PT Frequency: 1x/week  PT Duration: 10 weeks  Planned Interventions: Therapeutic  exercises, Therapeutic activity, Neuromuscular re-education, Balance training, Gait training, Patient/Family education, Self Care, Joint mobilization, Spinal mobilization, Cryotherapy, Moist heat, scar mobilization, Taping, and Manual therapy  Plan For Next Session: how did scar mobs go? PFM external, breath in diff positions    [facility], PT, DPT  02/05/2022, 1:12 PM

## 2022-02-13 ENCOUNTER — Ambulatory Visit: Payer: BC Managed Care – PPO | Attending: Obstetrics and Gynecology

## 2022-02-13 DIAGNOSIS — M6289 Other specified disorders of muscle: Secondary | ICD-10-CM | POA: Diagnosis not present

## 2022-02-13 DIAGNOSIS — R278 Other lack of coordination: Secondary | ICD-10-CM | POA: Diagnosis present

## 2022-02-13 DIAGNOSIS — M6281 Muscle weakness (generalized): Secondary | ICD-10-CM | POA: Diagnosis present

## 2022-02-13 NOTE — Therapy (Signed)
OUTPATIENT PHYSICAL THERAPY FEMALE PELVIC TREATMENT   Patient Name: Kyleah Pensabene MRN: [id number] DOB:1971-03-20, 51 y.o., female Today's Date: 02/13/2022   PT End of Session - 02/13/22 1408     Visit Number 3    Number of Visits 10    Date for PT Re-Evaluation 04/02/22    Authorization Type IE: 01/22/22    PT Start Time 1410    PT Stop Time 1440    PT Time Calculation (min) 30 min    Activity Tolerance Patient tolerated treatment well             Past Medical History:  Diagnosis Date   GERD (gastroesophageal reflux disease)    TB (pulmonary tuberculosis)    Past Surgical History:  Procedure Laterality Date   ABDOMINAL HYSTERECTOMY     CHOLECYSTECTOMY     COLONOSCOPY WITH PROPOFOL N/A 10/13/2020   Procedure: COLONOSCOPY WITH PROPOFOL;  Surgeon: Lesly Rubenstein, MD;  Location: [facility];  Service: Endoscopy;  Laterality: N/A;   ESOPHAGOGASTRODUODENOSCOPY (EGD) WITH PROPOFOL N/A 10/13/2020   Procedure: ESOPHAGOGASTRODUODENOSCOPY (EGD) WITH PROPOFOL;  Surgeon: Lesly Rubenstein, MD;  Location: [facility];  Service: Endoscopy;  Laterality: N/A;  [place] INTERPRETER   Patient Active Problem List   Diagnosis Date Noted   Chronic female pelvic pain 02/19/2013   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 Pt has noticed more when playing with her grandkids. Pt also has a small trampoline for physical activity and now has leakage and unable to continue with those exercises. Pt had a recent UTI and finishing antibiotics. After discussion, Pt continues to have some sxs of UTI. DPT stated to reach out to provider if not resolved fully by Friday. Pt verbalized understanding.   Are you having pain? No    PATIENT GOALS: Pt would like to control her urinary leakage and prevent worsening    UROLOGICAL HISTORY Fluid intake: water, coffee (up to 2 cups per day), tea   Pain with urination: No Fully empty bladder: Yes Stream: Strong Urgency: Yes  Toileting posture: heels raised with BMs Frequency: 8x, maybe every hour  Nocturia: 0-1x Leakage: Coughing, Sneezing, Laughing, and Exercise Pads: No Type: will change undergarments very occasionally but does have to cross legs and squeeze gluteals to hold urine Amount:    GASTROINTESTINAL HISTORY Pain with bowel movement: No Type of bowel movement:Type (Bristol Stool Scale) 3 and Strain Yes, occasionally Frequency: 1x/day Fully empty rectum: Yes Leakage: No   SEXUAL HISTORY/FUNCTION Pain with intercourse: Initial Penetration, During Penetration, and After Intercourse Ability to have vaginal penetration:  Yes: but occasionally with pain ; Deep thrusting:  Able to achieve orgasm?: Yes  OBSTETRICAL HISTORY Vaginal deliveries: G2P2  Tearing: episiotomy with one  C-section deliveries: no   GYNECOLOGICAL HISTORY Hysterectomy: yes, hysterectomy abdominal  Pelvic Organ Prolapse: None Pain with exam: no  Heaviness/pressure: no   SUBJECTIVE:  Pt arrived 10 mins after scheduled time. Addressed  late arrival to appointments. Pt verbalized understanding. Pt has been doing well.                                                                                                                                                                PAIN:   No     OBJECTIVE:    COGNITION: Overall cognitive status: Within functional limits for tasks assessed     POSTURE:   Iliac crest height: WNL Pelvic obliquity: WNL   RANGE OF MOTION:    (Norm range in degrees)  LEFT 02/05/22 RIGHT 02/05/22  Lumbar forward flexion (65):  WNL    Lumbar extension (30): WNL    Lumbar lateral flexion (25):  WNL WNL  Thoracic and Lumbar rotation (30 degrees):    WNL WNL  Hip Flexion (0-125):   WNL WNL  Hip IR (0-45):  WNL WNL  Hip ER (0-45):  WNL WNL  Hip Adduction:      Hip Abduction (0-40):  WNL WNL  Hip extension (0-15):     (*= pain, Blank rows = not tested)   STRENGTH: MMT    RLE 02/05/22 LLE 02/05/22  Hip Flexion 4 4  Hip Extension    Hip Abduction     Hip Adduction     Hip ER  5 5  Hip IR  5 5  Knee Extension 5 5  Knee Flexion 5 5  Dorsiflexion     Plantarflexion (seated) 5 5  (*= pain, Blank rows = not tested)   SPECIAL TESTS:   FABER (SN 81): negative B FADIR (SN 94): negative B    PALPATION:  Abdominal:  Diastasis: will assess next visit  Scar mobility: present, Hysterectomy scar above pubic bone, significant scar restriction above and at the scar with numbness - tender and occasional sharp pain (not during session)   -Palpation: Myofascial tension upon palpation along the center of the abdomen and the lower abdomen in the center  Tenderness at lower abdomen    TODAY'S TREATMENT  Manual Therapy: Abdominal myofascial release for improved fascial sling mobility and pain modulation  Myofascial tension upon palpation along the center of the abdomen and the lower abdomen in the center  Improved with increased time   Scar mobilization in  circular/vertical/horizontal motions for improved tissue extensibility  Hysterectomy scar above pubic bone, significant scar restriction above and at the scar with numbness   Neuromuscular Re-education: Pre-treatment assessment -   EXTERNAL PELVIC EXAM: Patient educated on the purpose of the pelvic exam and articulated understanding;  patient consented to the exam verbally.  Breath coordination: present but inconsistent Voluntary Contraction: present, 3/5 MMT Relaxation: full Perineal movement with sustained IAP increase ("bear down"): descent with Valsalva  Perineal movement with rapid IAP increase ("cough"): no change (0= no contraction, 1= flicker, 2= weak squeeze, 3= fair squeeze with lift, 4= good squeeze and lift against resistance, 5= strong squeeze against strong resistance)   Discussion and demonstration of relationship between diaphragm and pelvic floor musculature. Video shown.   Supine hooklying diaphragmatic breathing with VCs and TCs for downregulation of the nervous system and improved management of IAP Other diaphragmatic breathing in various positions - seated and quadruped    Patient response to interventions: Pt did not realize her breathing was very deep when she would take an inhale   Patient Education:  Patient educated on what to expect during course of physical therapy, POC, and provided with HEP including: diaphragmatic breathing in various positions. Patient educated throughout session on appropriate technique and form using multi-modal cueing, HEP, and activity modification. Patient will benefit from further education in order to maximize compliance and understanding for long-term therapeutic gains.      ASSESSMENT:  Clinical Impression: Patient presents to clinic with excellent motivation to participate in today's session. After PFM external assessment, Pt demonstrates deficits in PFM coordination as Pt with inconsistent breath coordination and Valsalva  maneuver noted with sustained IAP increase ("bear down"). After significant verbal and tactile cueing, Pt able to demonstrate improved PFM coordination. Pt demonstrates deficits in IAP management, PFM coordination, PFM strength, PFM extensibility, LE strength, pain, and scar mobility. Pt able to practice scar mobs at lower hysterectomy scar with some discomfort. Will continue to assess in future sessions. Pt practiced in various positions how to incorporate diaphragmatic breathing into daily habits. Patient responded positively to active and educational interventions. Patient will continue to benefit from skilled therapeutic intervention to address deficits in IAP management, PFM coordination, PFM strength, PFM extensibility, LE strength, pain, and scar mobility in order to increase PLOF and improve overall QOL.    Objective Impairments: decreased activity tolerance, decreased coordination, decreased strength, increased fascial restrictions, postural dysfunction, and pain.   Activity Limitations: bending, continence, toileting, and locomotion level  Personal Factors: Age, Behavior pattern, Past/current experiences, Time since onset of injury/illness/exacerbation, and 1 comorbidity: GERD  are also affecting patient's functional outcome.   Rehab Potential: Good  Clinical Decision Making: Evolving/moderate complexity  Evaluation Complexity: Moderate   GOALS: Goals reviewed with patient? Yes  SHORT TERM GOALS: Target date: 02/26/2022  Patient will demonstrate independence with HEP in order to maximize therapeutic gains and improve carryover from physical therapy sessions to ADLs in the home and community. Baseline: toileting posture handout Goal status: INITIAL    LONG TERM GOALS: Target date: 04/02/2022   Patient will demonstrate circumferential and sequential contraction of >3/5 MMT, > 5 sec hold x5 and 5 consecutive quick flicks with </= 10 min rest between testing bouts, and relaxation  of the PFM coordinated with breath for improved management of intra-abdominal pressure and normal bowel and bladder function without the presence of pain nor incontinence in order to improve participation at home and in the community. Baseline: 3/5 MMT, no endurance tested  Goal status: INITIAL  2.  Patient will demonstrate coordinated lengthening and relaxation of PFM with diaphragmatic inhalation in order to decrease spasm and allow for unrestricted elimination of urine/feces for improved overall QOL. Baseline: occasional straining to have a BM with heels raised  Goal status: INITIAL  3.  Patient will report less than 5 incidents of stress urinary incontinence or present with bodily compensations (adducted hips and Valsalva) over the course of 3 weeks while coughing/sneezing/laughing/jumping in order to demonstrate improved PFM coordination, strength, and function for improved overall QOL. Baseline: urinary leakage at times with all the above but having to compensate every time with all the above  Goal status: INITIAL  4.  Patient will report being able to return to activities including, but not limited to: intercourse, physical activity, interacting with grandkids outdoors without pain or limitation to indicate complete resolution of the chief concern and return to prior level of participation at home and in the community. Baseline: has pain with intercourse, limited to activity due to urinary leakage Goal status: INITIAL  5.  Patient will score >/= 68 on FOTO Urinary Problem  in order to demonstrate improved IAP management, improved PFM coordination, and overall QOL.  Baseline: 59 Goal status: INITIAL    PLAN: PT Frequency: 1x/week  PT Duration: 10 weeks  Planned Interventions: Therapeutic exercises, Therapeutic activity, Neuromuscular re-education, Balance training, Gait training, Patient/Family education, Self Care, Joint mobilization, Spinal mobilization, Cryotherapy, Moist heat,  scar mobilization, Taping, and Manual therapy  Plan For Next Session: scar manual, deep core start   Daaron Dimarco, PT, DPT  02/13/2022, 2:47 PM

## 2022-02-19 ENCOUNTER — Ambulatory Visit: Payer: BC Managed Care – PPO

## 2022-02-19 DIAGNOSIS — M6289 Other specified disorders of muscle: Secondary | ICD-10-CM

## 2022-02-19 DIAGNOSIS — M6281 Muscle weakness (generalized): Secondary | ICD-10-CM

## 2022-02-19 DIAGNOSIS — R278 Other lack of coordination: Secondary | ICD-10-CM

## 2022-02-19 NOTE — Therapy (Signed)
OUTPATIENT PHYSICAL THERAPY FEMALE PELVIC TREATMENT   Patient Name: Rhonda Olsen MRN: [id number] DOB:[birth year], 51 y.o., female Today's Date: 02/19/2022   PT End of Session - 02/19/22 1518     Visit Number 4    Number of Visits 10    Date for PT Re-Evaluation 04/02/22    Authorization Type IE: 01/22/22    PT Start Time 1520    PT Stop Time 1600    PT Time Calculation (min) 40 min    Activity Tolerance Patient tolerated treatment well             Past Medical History:  Diagnosis Date   GERD (gastroesophageal reflux disease)    TB (pulmonary tuberculosis)    Past Surgical History:  Procedure Laterality Date   ABDOMINAL HYSTERECTOMY     CHOLECYSTECTOMY     COLONOSCOPY WITH PROPOFOL N/A 10/13/2020   Procedure: COLONOSCOPY WITH PROPOFOL;  Surgeon: Lesly Rubenstein, MD;  Location: [facility] ENDOSCOPY;  Service: Endoscopy;  Laterality: N/A;   ESOPHAGOGASTRODUODENOSCOPY (EGD) WITH PROPOFOL N/A 10/13/2020   Procedure: ESOPHAGOGASTRODUODENOSCOPY (EGD) WITH PROPOFOL;  Surgeon: Lesly Rubenstein, MD;  Location: [facility] ENDOSCOPY;  Service: Endoscopy;  Laterality: N/A;  [place] INTERPRETER   Patient Active Problem List   Diagnosis Date Noted   Chronic female pelvic pain 02/19/2013   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 Pt has noticed more when playing with her grandkids. Pt also has a small trampoline for physical activity and now has leakage and unable to continue with those exercises. Pt had a recent UTI and finishing antibiotics. After discussion, Pt continues to have some sxs of UTI. DPT stated to reach out to provider if not resolved fully by Friday. Pt verbalized understanding.   Are you having pain? No    PATIENT GOALS: Pt would like to control her urinary leakage and prevent worsening    UROLOGICAL HISTORY Fluid intake: water, coffee (up to 2 cups per day), tea   Pain with urination: No Fully empty bladder: Yes Stream: Strong Urgency: Yes  Toileting posture: heels raised with BMs Frequency: 8x, maybe every hour  Nocturia: 0-1x Leakage: Coughing, Sneezing, Laughing, and Exercise Pads: No Type: will change undergarments very occasionally but does have to cross legs and squeeze gluteals to hold urine Amount:    GASTROINTESTINAL HISTORY Pain with bowel movement: No Type of bowel movement:Type (Bristol Stool Scale) 3 and Strain Yes, occasionally Frequency: 1x/day Fully empty rectum: Yes Leakage: No   SEXUAL HISTORY/FUNCTION Pain with intercourse: Initial Penetration, During Penetration, and After Intercourse Ability to have vaginal penetration:  Yes: but occasionally with pain ; Deep thrusting:  Able to achieve orgasm?: Yes  OBSTETRICAL HISTORY Vaginal deliveries: G2P2  Tearing: episiotomy with one  C-section deliveries: no   GYNECOLOGICAL HISTORY Hysterectomy: yes, hysterectomy abdominal  Pelvic Organ Prolapse: None Pain with exam: no  Heaviness/pressure: no   SUBJECTIVE:  Pt has been practicing diaphragmatic breathing in  various positions and has no concerns about that.                                                                                                                                                           PAIN:   No    OBJECTIVE:    COGNITION: Overall cognitive status: Within functional limits for tasks assessed     POSTURE:   Iliac crest height: WNL Pelvic obliquity: WNL   RANGE OF MOTION:    (Norm range in degrees)  LEFT 02/05/22 RIGHT 02/05/22  Lumbar forward flexion (65):  WNL    Lumbar extension (30): WNL    Lumbar lateral flexion (25):  WNL WNL  Thoracic and Lumbar rotation (30 degrees):    WNL WNL  Hip Flexion (0-125):   WNL WNL  Hip IR (0-45):  WNL WNL  Hip ER (0-45):  WNL WNL  Hip Adduction:      Hip Abduction (0-40):  WNL WNL  Hip extension (0-15):     (*= pain, Blank rows = not tested)   STRENGTH: MMT    RLE 02/05/22 LLE 02/05/22  Hip Flexion 4 4  Hip Extension    Hip Abduction     Hip Adduction     Hip ER  5 5  Hip IR  5 5  Knee Extension 5 5  Knee Flexion 5 5  Dorsiflexion     Plantarflexion (seated) 5 5  (*= pain, Blank rows = not tested)   SPECIAL TESTS:   FABER (SN 81): negative B FADIR (SN 94): negative B    PALPATION:  Abdominal:  Diastasis: will assess next visit  Scar mobility: present, Hysterectomy scar above pubic bone, significant scar restriction above and at the scar with numbness - tender and occasional sharp pain (not during session)   -Palpation: Myofascial tension upon palpation along the center of the abdomen and the lower abdomen in the center  Tenderness at lower abdomen   EXTERNAL PELVIC EXAM: Patient educated on the purpose of the pelvic exam and articulated understanding; patient consented to the exam verbally.  Breath coordination: present but inconsistent Voluntary Contraction: present, 3/5 MMT Relaxation: full Perineal movement with sustained IAP increase ("bear down"): descent with Valsalva  Perineal  movement with rapid IAP increase ("cough"): no change (0= no contraction, 1= flicker, 2= weak squeeze, 3= fair squeeze with lift, 4= good squeeze and lift against resistance, 5= strong squeeze against strong resistance)   TODAY'S TREATMENT  Manual Therapy: Abdominal myofascial release for improved fascial sling mobility and effleurage before scar mobility  Scar mobilization in circular/vertical/horizontal motions for improved tissue extensibility  Hysterectomy scar above pubic bone, scar restriction noted towards the R and some numbness but different compared to last session, improvement  Neuromuscular Re-education: Supine hooklying diaphragmatic breathing with VCs and TCs for downregulation of the nervous system and improved management of IAP  [place] abdominal rehab   Supine hooklying TrA contraction with coordinated exhale   Supine hooklying TrA contraction with bent knee fall outs for improved IAP management   Seated diaphragmatic breathing with VCs and TCs for downregulation of the nervous system and improved management of IAP  Seated TrA activation with coordinated breathing for improved IAP management  Added LE challenge - seated marches    Patient response to interventions: Pt able to feel subtle movement of TrA activation    Patient Education:  Patient provided with HEP including: seated breathing, TrA activation in seated and supine with marches. Patient educated throughout session on appropriate technique and form using multi-modal cueing, HEP, and activity modification. Patient will benefit from further education in order to maximize compliance and understanding for long-term therapeutic gains.      ASSESSMENT:  Clinical Impression: Patient presents to clinic with excellent motivation to participate in today's session. Pt continues to demonstrates deficits in IAP management, PFM coordination, PFM strength, PFM extensibility, LE strength, pain, and scar mobility.  Upon palpation, lower hysterectomy scar with scar restriction towards the R > L but improved sensation per Pt from last session (decreased numbness). Pt required moderate VCs and TCs with TrA activation in various positions to decrease bodily compensations and for proper technique. Discussion on action of transverse abdominus in relation to the pelvic floor and postural stability. Patient responded positively to active and educational interventions. Patient will continue to benefit from skilled therapeutic intervention to address deficits in IAP management, PFM coordination, PFM strength, PFM extensibility, LE strength, pain, and scar mobility in order to increase PLOF and improve overall QOL.    Objective Impairments: decreased activity tolerance, decreased coordination, decreased strength, increased fascial restrictions, postural dysfunction, and pain.   Activity Limitations: bending, continence, toileting, and locomotion level  Personal Factors: Age, Behavior pattern, Past/current experiences, Time since onset of injury/illness/exacerbation, and 1 comorbidity: GERD  are also affecting patient's functional outcome.   Rehab Potential: Good  Clinical Decision Making: Evolving/moderate complexity  Evaluation Complexity: Moderate   GOALS: Goals reviewed with patient? Yes  SHORT TERM GOALS: Target date: 02/26/2022  Patient will demonstrate independence with HEP in order to maximize therapeutic gains and improve carryover from physical therapy sessions to ADLs in the home and community. Baseline: toileting posture handout Goal status: INITIAL    LONG TERM GOALS: Target date: 04/02/2022   Patient will demonstrate circumferential and sequential contraction of >3/5 MMT, > 5 sec hold x5 and 5 consecutive quick flicks with </= 10 min rest between testing bouts, and relaxation of the PFM coordinated with breath for improved management of intra-abdominal pressure and normal bowel and bladder  function without the presence of pain nor incontinence in order to improve participation at home and in the community. Baseline: 3/5 MMT, no endurance tested  Goal status: INITIAL  2.  Patient will demonstrate coordinated lengthening and relaxation of PFM with diaphragmatic inhalation in order to decrease spasm and allow for unrestricted elimination of urine/feces for improved overall QOL. Baseline: occasional straining to have a BM with heels raised  Goal status: INITIAL  3.  Patient will report less than 5 incidents of stress urinary incontinence or present with  bodily compensations (adducted hips and Valsalva) over the course of 3 weeks while coughing/sneezing/laughing/jumping in order to demonstrate improved PFM coordination, strength, and function for improved overall QOL. Baseline: urinary leakage at times with all the above but having to compensate every time with all the above  Goal status: INITIAL  4.  Patient will report being able to return to activities including, but not limited to: intercourse, physical activity, interacting with grandkids outdoors without pain or limitation to indicate complete resolution of the chief concern and return to prior level of participation at home and in the community. Baseline: has pain with intercourse, limited to activity due to urinary leakage Goal status: INITIAL  5.  Patient will score >/= 68 on FOTO Urinary Problem  in order to demonstrate improved IAP management, improved PFM coordination, and overall QOL.  Baseline: 59 Goal status: INITIAL    PLAN: PT Frequency: 1x/week  PT Duration: 10 weeks  Planned Interventions: Therapeutic exercises, Therapeutic activity, Neuromuscular re-education, Balance training, Gait training, Patient/Family education, Self Care, Joint mobilization, Spinal mobilization, Cryotherapy, Moist heat, scar mobilization, Taping, and Manual therapy  Plan For Next Session: continue deep core, standing (wall, pallof,  bird dog/dead bug)    Infantof Villagomez, PT, DPT  02/19/2022, 3:18 PM

## 2022-02-26 ENCOUNTER — Ambulatory Visit: Payer: BC Managed Care – PPO

## 2022-02-26 DIAGNOSIS — M6289 Other specified disorders of muscle: Secondary | ICD-10-CM | POA: Diagnosis not present

## 2022-02-26 DIAGNOSIS — M6281 Muscle weakness (generalized): Secondary | ICD-10-CM

## 2022-02-26 DIAGNOSIS — R278 Other lack of coordination: Secondary | ICD-10-CM

## 2022-02-26 NOTE — Therapy (Signed)
OUTPATIENT PHYSICAL THERAPY FEMALE PELVIC TREATMENT   Patient Name: Rhonda Olsen MRN: [id number] DOB:1970-11-17, 51 y.o., female Today's Date: 02/26/2022   PT End of Session - 02/26/22 1618     Visit Number 5    Number of Visits 10    Date for PT Re-Evaluation 04/02/22    Authorization Type IE: 01/22/22    PT Start Time 1615    PT Stop Time 3546    PT Time Calculation (min) 38 min    Activity Tolerance Patient tolerated treatment well             Past Medical History:  Diagnosis Date   GERD (gastroesophageal reflux disease)    TB (pulmonary tuberculosis)    Past Surgical History:  Procedure Laterality Date   ABDOMINAL HYSTERECTOMY     CHOLECYSTECTOMY     COLONOSCOPY WITH PROPOFOL N/A 10/13/2020   Procedure: COLONOSCOPY WITH PROPOFOL;  Surgeon: Lesly Rubenstein, MD;  Location: [facility] ENDOSCOPY;  Service: Endoscopy;  Laterality: N/A;   ESOPHAGOGASTRODUODENOSCOPY (EGD) WITH PROPOFOL N/A 10/13/2020   Procedure: ESOPHAGOGASTRODUODENOSCOPY (EGD) WITH PROPOFOL;  Surgeon: Lesly Rubenstein, MD;  Location: [facility] ENDOSCOPY;  Service: Endoscopy;  Laterality: N/A;  [place] INTERPRETER   Patient Active Problem List   Diagnosis Date Noted   Chronic female pelvic pain 02/19/2013   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 Pt has noticed more when playing with her grandkids. Pt also has a small trampoline for physical activity and now has leakage and unable to continue with those exercises. Pt had a recent UTI and finishing antibiotics. After discussion, Pt continues to have some sxs of UTI. DPT stated to reach out to provider if not resolved fully by Friday. Pt verbalized understanding.   Are you having pain? No    PATIENT GOALS: Pt would like to control her urinary leakage and prevent worsening    UROLOGICAL HISTORY Fluid intake: water, coffee (up to 2 cups per day), tea   Pain with urination: No Fully empty bladder: Yes Stream: Strong Urgency: Yes  Toileting posture: heels raised with BMs Frequency: 8x, maybe every hour  Nocturia: 0-1x Leakage: Coughing, Sneezing, Laughing, and Exercise Pads: No Type: will change undergarments very occasionally but does have to cross legs and squeeze gluteals to hold urine Amount:    GASTROINTESTINAL HISTORY Pain with bowel movement: No Type of bowel movement:Type (Bristol Stool Scale) 3 and Strain Yes, occasionally Frequency: 1x/day Fully empty rectum: Yes Leakage: No   SEXUAL HISTORY/FUNCTION Pain with intercourse: Initial Penetration, During Penetration, and After Intercourse Ability to have vaginal penetration:  Yes: but occasionally with pain ; Deep thrusting:  Able to achieve orgasm?: Yes  OBSTETRICAL HISTORY Vaginal deliveries: G2P2  Tearing: episiotomy with one  C-section deliveries: no   GYNECOLOGICAL HISTORY Hysterectomy: yes, hysterectomy abdominal  Pelvic Organ Prolapse: None Pain with exam: no  Heaviness/pressure: no   SUBJECTIVE:  Pt has been trying new HEP and has been doing well.  Pt has no concerns                                                                                                                                                         PAIN:   No    OBJECTIVE:    COGNITION: Overall cognitive status: Within functional limits for tasks assessed     POSTURE:   Iliac crest height: WNL Pelvic obliquity: WNL   RANGE OF MOTION:    (Norm range in degrees)  LEFT 02/05/22 RIGHT 02/05/22  Lumbar forward flexion (65):  WNL    Lumbar extension (30): WNL    Lumbar lateral flexion (25):  WNL WNL  Thoracic and Lumbar rotation (30 degrees):    WNL WNL  Hip Flexion (0-125):   WNL WNL  Hip IR (0-45):  WNL WNL  Hip ER (0-45):  WNL WNL  Hip Adduction:      Hip Abduction (0-40):  WNL WNL  Hip extension (0-15):     (*= pain, Blank rows = not tested)   STRENGTH: MMT    RLE 02/05/22 LLE 02/05/22  Hip Flexion 4 4  Hip Extension    Hip Abduction     Hip Adduction     Hip ER  5 5  Hip IR  5 5  Knee Extension 5 5  Knee Flexion 5 5  Dorsiflexion     Plantarflexion (seated) 5 5  (*= pain, Blank rows = not tested)   SPECIAL TESTS:   FABER (SN 81): negative B FADIR (SN 94): negative B    PALPATION:  Abdominal:  Diastasis: will assess next visit  Scar mobility: present, Hysterectomy scar above pubic bone, significant scar restriction above and at the scar with numbness - tender and occasional sharp pain (not during session)   -Palpation: Myofascial tension upon palpation along the center of the abdomen and the lower abdomen in the center  Tenderness at lower abdomen   EXTERNAL PELVIC EXAM: Patient educated on the purpose of the pelvic exam and articulated understanding; patient consented to the exam verbally.  Breath coordination: present but inconsistent Voluntary Contraction: present, 3/5 MMT Relaxation: full Perineal movement with sustained IAP increase ("bear down"): descent with Valsalva  Perineal movement with rapid IAP increase  ("cough"): no change (0= no contraction, 1= flicker, 2= weak squeeze, 3= fair squeeze with lift, 4= good squeeze and lift against resistance, 5= strong squeeze against strong resistance)   TODAY'S TREATMENT  Neuromuscular Re-education: Supine hooklying diaphragmatic breathing with VCs and TCs for downregulation of the nervous system and improved  management of IAP  Supine 90/90 holds isometric with coordinated breathing for improved IAP management, VCs and TCs required  Planks at lowered bed height, 2 x30 secs, with coordinated breathing for improved IAP management, VCs and TCs required  Wall squats with added UE challenge (forward flexion B), x15, with coordinated breathing for improved IAP management, VCs and TCs required   Patient response to interventions: Pt able to feel more TrA activation with supine 90/90   Patient Education:  Patient provided with HEP including: supine 90/90, wall squats with front raise, planks. Patient educated throughout session on appropriate technique and form using multi-modal cueing, HEP, and activity modification. Patient will benefit from further education in order to maximize compliance and understanding for long-term therapeutic gains.      ASSESSMENT:  Clinical Impression: Patient presents to clinic with excellent motivation to participate in today's session. Pt continues to demonstrates deficits in IAP management, PFM coordination, PFM strength, PFM extensibility, LE strength, pain, and scar mobility. Pt continues to require moderate verbal and tactile cueing with TrA activation in various positions to decrease bodily compensations (forward flexion with wall squat and continued breathing). Pt observed to Valsalva during more difficult activities but with increased time able to demonstrate understanding. Patient responded positively to active and educational interventions. Patient will continue to benefit from skilled therapeutic intervention to  address deficits in IAP management, PFM coordination, PFM strength, PFM extensibility, LE strength, pain, and scar mobility in order to increase PLOF and improve overall QOL.    Objective Impairments: decreased activity tolerance, decreased coordination, decreased strength, increased fascial restrictions, postural dysfunction, and pain.   Activity Limitations: bending, continence, toileting, and locomotion level  Personal Factors: Age, Behavior pattern, Past/current experiences, Time since onset of injury/illness/exacerbation, and 1 comorbidity: GERD  are also affecting patient's functional outcome.   Rehab Potential: Good  Clinical Decision Making: Evolving/moderate complexity  Evaluation Complexity: Moderate   GOALS: Goals reviewed with patient? Yes  SHORT TERM GOALS: Target date: 02/26/2022  Patient will demonstrate independence with HEP in order to maximize therapeutic gains and improve carryover from physical therapy sessions to ADLs in the home and community. Baseline: toileting posture handout Goal status: INITIAL    LONG TERM GOALS: Target date: 04/02/2022   Patient will demonstrate circumferential and sequential contraction of >3/5 MMT, > 5 sec hold x5 and 5 consecutive quick flicks with </= 10 min rest between testing bouts, and relaxation of the PFM coordinated with breath for improved management of intra-abdominal pressure and normal bowel and bladder function without the presence of pain nor incontinence in order to improve participation at home and in the community. Baseline: 3/5 MMT, no endurance tested  Goal status: INITIAL  2.  Patient will demonstrate coordinated lengthening and relaxation of PFM with diaphragmatic inhalation in order to decrease spasm and allow for unrestricted elimination of urine/feces for improved overall QOL. Baseline: occasional straining to have a BM with heels raised  Goal status: INITIAL  3.  Patient will report less than 5 incidents of  stress urinary incontinence or present with bodily compensations (adducted hips and Valsalva) over the course of 3 weeks while coughing/sneezing/laughing/jumping in order to demonstrate improved PFM coordination, strength, and function for improved overall QOL. Baseline: urinary leakage at times with all the above but having to compensate every time with all the above  Goal status: INITIAL  4.  Patient will report being able to return to activities including, but not limited to: intercourse, physical activity, interacting with grandkids outdoors without  pain or limitation to indicate complete resolution of the chief concern and return to prior level of participation at home and in the community. Baseline: has pain with intercourse, limited to activity due to urinary leakage Goal status: INITIAL  5.  Patient will score >/= 68 on FOTO Urinary Problem  in order to demonstrate improved IAP management, improved PFM coordination, and overall QOL.  Baseline: 59 Goal status: INITIAL    PLAN: PT Frequency: 1x/week  PT Duration: 10 weeks  Planned Interventions: Therapeutic exercises, Therapeutic activity, Neuromuscular re-education, Balance training, Gait training, Patient/Family education, Self Care, Joint mobilization, Spinal mobilization, Cryotherapy, Moist heat, scar mobilization, Taping, and Manual therapy  Plan For Next Session: deep core (dead bug or bird dog), pallof press, or go into the gym and practice what she does in the gym, incorporate 2lb weights    [employer], PT, DPT  02/26/2022, 4:58 PM

## 2022-03-07 ENCOUNTER — Ambulatory Visit: Payer: BC Managed Care – PPO

## 2022-03-07 DIAGNOSIS — M6289 Other specified disorders of muscle: Secondary | ICD-10-CM | POA: Diagnosis not present

## 2022-03-07 DIAGNOSIS — M6281 Muscle weakness (generalized): Secondary | ICD-10-CM

## 2022-03-07 DIAGNOSIS — R278 Other lack of coordination: Secondary | ICD-10-CM

## 2022-03-07 NOTE — Therapy (Signed)
OUTPATIENT PHYSICAL THERAPY FEMALE PELVIC DISCHARGE   Patient Name: Rhonda Olsen MRN: [id number] DOB:09/27/1970, 51 y.o., female Today's Date: 03/08/2022   PT End of Session - 03/07/22 1537     Visit Number 6    Number of Visits 10    Date for PT Re-Evaluation 04/02/22    Authorization Type IE: 01/22/22    PT Start Time 1530    PT Stop Time 1555    PT Time Calculation (min) 25 min    Activity Tolerance Patient tolerated treatment well             Past Medical History:  Diagnosis Date   GERD (gastroesophageal reflux disease)    TB (pulmonary tuberculosis)    Past Surgical History:  Procedure Laterality Date   ABDOMINAL HYSTERECTOMY     CHOLECYSTECTOMY     COLONOSCOPY WITH PROPOFOL N/A 10/13/2020   Procedure: COLONOSCOPY WITH PROPOFOL;  Surgeon: Lesly Rubenstein, MD;  Location: [facility] ENDOSCOPY;  Service: Endoscopy;  Laterality: N/A;   ESOPHAGOGASTRODUODENOSCOPY (EGD) WITH PROPOFOL N/A 10/13/2020   Procedure: ESOPHAGOGASTRODUODENOSCOPY (EGD) WITH PROPOFOL;  Surgeon: Lesly Rubenstein, MD;  Location: [facility] ENDOSCOPY;  Service: Endoscopy;  Laterality: N/A;  [place] INTERPRETER   Patient Active Problem List   Diagnosis Date Noted   Chronic female pelvic pain 02/19/2013   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 No  OCCUPATION/SOCIAL ACTIVITIES: [employer], sitting in an office,  handcrafts, playing with grandkids  PLOF: Independent    CHIEF CONCERN: Spanish interpretation provided by DPT (Certified by [facility])  Pt has urinary leakage with bending/jumping/laughing/coughing/sneezing and had been worse over the past year. Pt has noticed more when playing with her grandkids. Pt also has a small trampoline for physical activity and now has leakage and unable to continue with those exercises. Pt had a recent UTI and finishing antibiotics. After discussion, Pt continues to have some sxs of UTI. DPT stated to reach out to provider if not resolved fully by Friday. Pt verbalized understanding.   Are you having pain? No    PATIENT GOALS: Pt would like to control her urinary leakage and prevent worsening    UROLOGICAL HISTORY Fluid intake: water, coffee (up to 2 cups per day), tea   Pain with urination: No Fully empty bladder: Yes Stream: Strong Urgency: Yes  Toileting posture: heels raised with BMs Frequency: 8x, maybe every hour  Nocturia: 0-1x Leakage: Coughing, Sneezing, Laughing, and Exercise Pads: No Type: will change undergarments very occasionally but does have to cross legs and squeeze gluteals to hold urine Amount:    GASTROINTESTINAL HISTORY Pain with bowel movement: No Type of bowel movement:Type (Bristol Stool Scale) 3 and Strain Yes, occasionally Frequency: 1x/day Fully empty rectum: Yes Leakage: No   SEXUAL HISTORY/FUNCTION Pain with intercourse: Initial Penetration, During Penetration, and After Intercourse Ability to have vaginal penetration:  Yes: but occasionally with pain ; Deep thrusting:  Able to achieve orgasm?: Yes  OBSTETRICAL HISTORY Vaginal deliveries: G2P2  Tearing: episiotomy with one  C-section deliveries: no   GYNECOLOGICAL HISTORY Hysterectomy: yes, hysterectomy abdominal  Pelvic Organ Prolapse: None Pain with exam: no  Heaviness/pressure: no   SUBJECTIVE:  Pt has been doing well and feels she has learned a  lot with pelvic floor physical therapy. Pt is unsure of when vacation will be happening in December so she would like to discharge as she has not been having major problems with urinary leakage the past few weeks.                                                                                                                                              PAIN:   No    OBJECTIVE:    COGNITION: Overall cognitive status: Within functional limits for tasks assessed     POSTURE:   Iliac crest height: WNL Pelvic obliquity: WNL   RANGE OF MOTION:    (Norm range in degrees)  LEFT 02/05/22 RIGHT 02/05/22  Lumbar forward flexion (65):  WNL    Lumbar extension (30): WNL    Lumbar lateral flexion (25):  WNL WNL  Thoracic and Lumbar rotation (30 degrees):    WNL WNL  Hip Flexion (0-125):   WNL WNL  Hip IR (0-45):  WNL WNL  Hip ER (0-45):  WNL WNL  Hip Adduction:      Hip Abduction (0-40):  WNL WNL  Hip extension (0-15):     (*= pain, Blank rows = not tested)   STRENGTH: MMT    RLE 02/05/22 LLE 02/05/22  Hip Flexion 4 4  Hip Extension    Hip Abduction     Hip Adduction     Hip ER  5 5  Hip IR  5 5  Knee Extension 5 5  Knee Flexion 5 5  Dorsiflexion     Plantarflexion (seated) 5 5  (*= pain, Blank rows = not tested)   SPECIAL TESTS:   FABER (SN 81): negative B FADIR (SN 94): negative B    PALPATION:  Abdominal:  Diastasis: will assess next visit  Scar mobility: present, Hysterectomy scar above pubic bone, significant scar restriction above and at the scar with numbness - tender and occasional sharp pain (not during session)   -Palpation: Myofascial tension upon palpation along the center of the abdomen and the lower abdomen in the center  Tenderness at lower abdomen   EXTERNAL PELVIC EXAM: Patient educated on the purpose of the pelvic exam and articulated understanding; patient consented to the exam verbally.  Breath coordination: present but  inconsistent Voluntary Contraction: present, 3/5 MMT Relaxation: full Perineal movement with sustained IAP increase ("bear down"): descent with Valsalva  Perineal movement with rapid IAP increase ("cough"): no change (0= no contraction, 1= flicker, 2= weak squeeze, 3= fair squeeze with lift, 4= good squeeze and lift against resistance, 5= strong squeeze against strong resistance)  TODAY'S TREATMENT  Neuromuscular Re-education: Reassessed goals below for discharge  Pt met 4/6 goals below  Discussion on improvement since IE on 01/22/22  Reassessed FOTO  Urinary Problem - IE: 59, Today: 61    Brief review of HEP from last session: Wall planks, standing dead bug at wall, and supine dead bug isometric  Pt comfortable with HEP and will continue    Patient response to interventions: Pt comfortable to discharge   Patient Education:  Patient provided with HEP including: no new adds. Patient educated throughout session on appropriate technique and form using multi-modal cueing, HEP, and activity modification. Patient will benefit from further education in order to maximize compliance and understanding for long-term therapeutic gains.      ASSESSMENT:  Clinical Impression: Patient presents to clinic with excellent motivation to participate in today's discharge session. Pt has made slight improvements from IE on 01/22/22 in relation to PFM coordination, IAP management, and posture. Based on FOTO Urinary Problem, Pt scored a 61 compared to a 59 on IE; however, did not meet FOTO goal as Pt will like to discontinue PFPT for reasons of Pt is content with progress. Pt has been able to not rely on bodily compensations (adducted hips, crossing of legs, or Valsalva maneuver) with increased IAP (sneezing, coughing, laughing, jumping) and with minimal urinary incontinence. Pt reports only once incidence of SUI over the past 3 weeks with coughing due to being sick. In addition, Pt reports no straining  to have BMs due to proper toileting posture and no pain with penetrative sex. Pt has met 4/6 goals created from IE. Pt would continue to benefit from PFPT to address remaining deficits and continuing to improve IAP management, but at this time Pt agrees to and is adequate for discharge.     Objective Impairments: decreased activity tolerance, decreased coordination, decreased strength, increased fascial restrictions, postural dysfunction, and pain.   Activity Limitations: bending, continence, toileting, and locomotion level  Personal Factors: Age, Behavior pattern, Past/current experiences, Time since onset of injury/illness/exacerbation, and 1 comorbidity: GERD  are also affecting patient's functional outcome.   Rehab Potential: Good  Clinical Decision Making: Evolving/moderate complexity  Evaluation Complexity: Moderate   GOALS: Goals reviewed with patient? Yes  SHORT TERM GOALS: Target date: 02/26/2022  Patient will demonstrate independence with HEP in order to maximize therapeutic gains and improve carryover from physical therapy sessions to ADLs in the home and community. Baseline: toileting posture handout; (11/30): Pt is IND with HEP Goal status: MET    LONG TERM GOALS: Target date: 04/02/2022   Patient will demonstrate circumferential and sequential contraction of >3/5 MMT, > 5 sec hold x5 and 5 consecutive quick flicks with </= 10 min rest between testing bouts, and relaxation of the PFM coordinated with breath for improved management of intra-abdominal pressure and normal bowel and bladder function without the presence of pain nor incontinence in order to improve participation at home and in the community. Baseline: 3/5 MMT, no endurance tested: (11/30): did not assess but improved breath coordination observed in sitting and lying - endurance not tested Goal status: NOT MET  2.  Patient will demonstrate coordinated lengthening and relaxation of PFM with diaphragmatic  inhalation in order to decrease spasm and allow for unrestricted elimination of urine/feces for improved overall QOL. Baseline: occasional straining to have a BM with heels raised; (11/30): Pt does not have to strain or raise heels during BMs Goal status: MET  3.  Patient will report less than 5 incidents of  stress urinary incontinence or present with bodily compensations (adducted hips and Valsalva) over the course of 3 weeks while coughing/sneezing/laughing/jumping in order to demonstrate improved PFM coordination, strength, and function for improved overall QOL. Baseline: urinary leakage at times with all the above but having to compensate every time with all the above; (11/30): only leaked 1 with coughing in the past 3 weeks and has not tried to jump on trampoline but did jump in session with no leakage Goal status: MET  4.  Patient will report being able to return to activities including, but not limited to: intercourse, physical activity, interacting with grandkids outdoors without pain or limitation to indicate complete resolution of the chief concern and return to prior level of participation at home and in the community. Baseline: has pain with intercourse, limited to activity due to urinary leakage; (11/30): Pt has not had any pain with penetrative sex and not limited with activities in the home or in the community due to urinary leakage  Goal status: MET  5.  Patient will score >/= 68 on FOTO Urinary Problem  in order to demonstrate improved IAP management, improved PFM coordination, and overall QOL.  Baseline: 59; (11/3)): 61 Goal status: NOT MET    PLAN: PT Frequency: 1x/week  PT Duration: 10 weeks  Planned Interventions: Therapeutic exercises, Therapeutic activity, Neuromuscular re-education, Balance training, Gait training, Patient/Family education, Self Care, Joint mobilization, Spinal mobilization, Cryotherapy, Moist heat, scar mobilization, Taping, and Manual  therapy    Nichole Montour, PT, DPT  03/08/2022, 7:50 AM

## 2022-03-19 ENCOUNTER — Ambulatory Visit: Payer: BC Managed Care – PPO

## 2022-03-26 ENCOUNTER — Ambulatory Visit: Payer: BC Managed Care – PPO

## 2022-05-08 ENCOUNTER — Other Ambulatory Visit: Payer: Self-pay | Admitting: Family Medicine

## 2022-05-08 DIAGNOSIS — R1011 Right upper quadrant pain: Secondary | ICD-10-CM

## 2022-05-08 DIAGNOSIS — K760 Fatty (change of) liver, not elsewhere classified: Secondary | ICD-10-CM

## 2022-05-08 DIAGNOSIS — R7989 Other specified abnormal findings of blood chemistry: Secondary | ICD-10-CM

## 2022-05-16 ENCOUNTER — Ambulatory Visit
Admission: RE | Admit: 2022-05-16 | Discharge: 2022-05-16 | Disposition: A | Discharge status: Home / Self Care | Payer: BC Managed Care – PPO | Source: Ambulatory Visit | Attending: Family Medicine | Admitting: Family Medicine

## 2022-05-16 DIAGNOSIS — K760 Fatty (change of) liver, not elsewhere classified: Secondary | ICD-10-CM | POA: Diagnosis present

## 2022-05-16 DIAGNOSIS — R7989 Other specified abnormal findings of blood chemistry: Secondary | ICD-10-CM | POA: Diagnosis present

## 2022-05-16 DIAGNOSIS — R1011 Right upper quadrant pain: Secondary | ICD-10-CM | POA: Diagnosis present

## 2022-09-12 ENCOUNTER — Other Ambulatory Visit: Payer: Self-pay | Admitting: Family Medicine

## 2022-09-12 DIAGNOSIS — Z1231 Encounter for screening mammogram for malignant neoplasm of breast: Secondary | ICD-10-CM
